# Patient Record
Sex: Male | Born: 1955 | Race: White | Hispanic: No | Marital: Married | Smoking: Former smoker
Health system: Southern US, Community
[De-identification: ages and names within clinical notes are randomized; demographics above are authoritative.]

## PROBLEM LIST (undated history)

## (undated) DIAGNOSIS — K589 Irritable bowel syndrome without diarrhea: Secondary | ICD-10-CM

## (undated) DIAGNOSIS — N200 Calculus of kidney: Secondary | ICD-10-CM

## (undated) DIAGNOSIS — L309 Dermatitis, unspecified: Secondary | ICD-10-CM

## (undated) DIAGNOSIS — G4730 Sleep apnea, unspecified: Secondary | ICD-10-CM

## (undated) DIAGNOSIS — D696 Thrombocytopenia, unspecified: Secondary | ICD-10-CM

## (undated) DIAGNOSIS — E785 Hyperlipidemia, unspecified: Secondary | ICD-10-CM

## (undated) DIAGNOSIS — M5136 Other intervertebral disc degeneration, lumbar region: Secondary | ICD-10-CM

## (undated) DIAGNOSIS — K219 Gastro-esophageal reflux disease without esophagitis: Secondary | ICD-10-CM

## (undated) DIAGNOSIS — M51369 Other intervertebral disc degeneration, lumbar region without mention of lumbar back pain or lower extremity pain: Secondary | ICD-10-CM

## (undated) HISTORY — PX: APPENDECTOMY: SHX54

## (undated) HISTORY — PX: HERNIA REPAIR: SHX51

## (undated) HISTORY — PX: COLONOSCOPY: SHX174

---

## 2006-05-28 ENCOUNTER — Ambulatory Visit: Payer: Self-pay | Admitting: Rheumatology

## 2006-06-16 ENCOUNTER — Ambulatory Visit: Payer: Self-pay | Admitting: Gastroenterology

## 2007-01-15 HISTORY — PX: EYE SURGERY: SHX253

## 2007-04-03 ENCOUNTER — Ambulatory Visit: Payer: Self-pay | Admitting: Internal Medicine

## 2008-08-13 IMAGING — CT CT ABD-PELV W/O CM
1 of 2 series · 15 of 32 positions shown, 19 images · non-contrast
Comparison: none

REASON FOR EXAM: Stones
COMMENTS:

PROCEDURE:     CT  - CT ABDOMEN AND PELVIS W[DATE] [DATE]
RESULT:     The patient reports a history of appendicitis with appendectomy.
TECHNIQUE: The study is performed utilizing a multislice helical acquisition
with data reconstructed at 3.0 mm slice thickness. No contrast was utilized
for the examination.

[Series 2: soft tissue · axial · 0.73mm/px · z∈[-492,-80]mm · 15 of 149 slices shown, 19 images]
[im 6/149  soft-tissue]
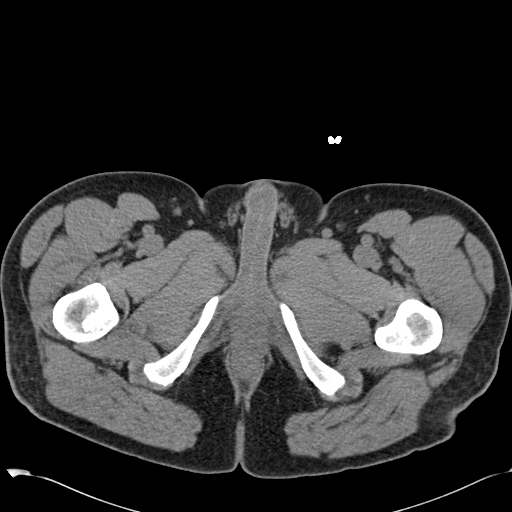
[im 6/149  bone]
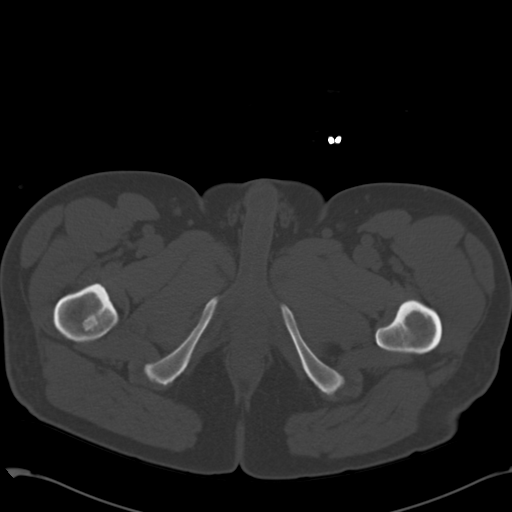
[im 18/149  soft-tissue]
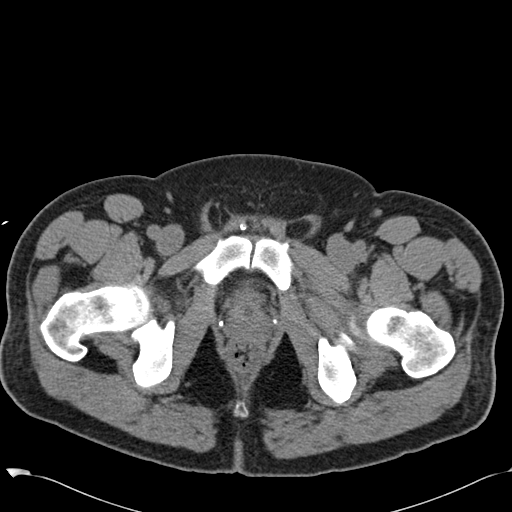
[im 29/149  soft-tissue]
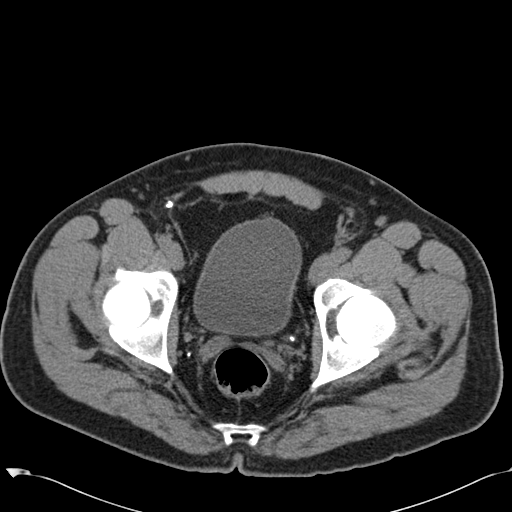
[im 40/149  soft-tissue]
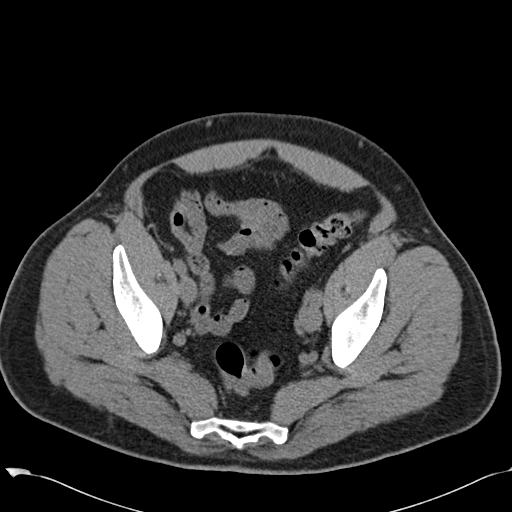
[im 52/149  soft-tissue]
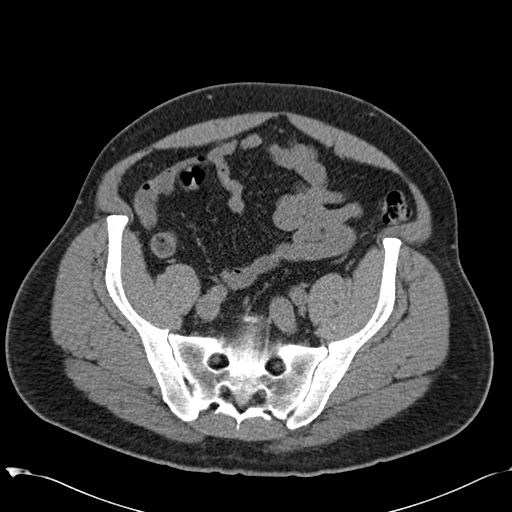
[im 63/149  soft-tissue]
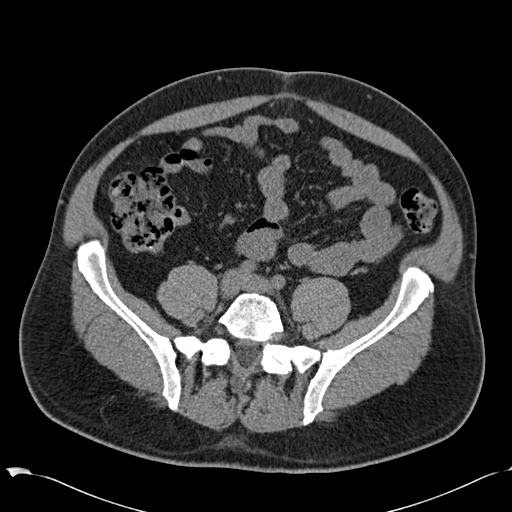
[im 75/149  soft-tissue]
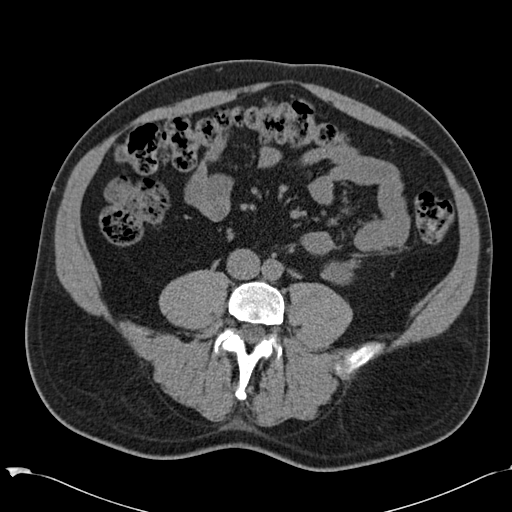
[im 86/149  soft-tissue]
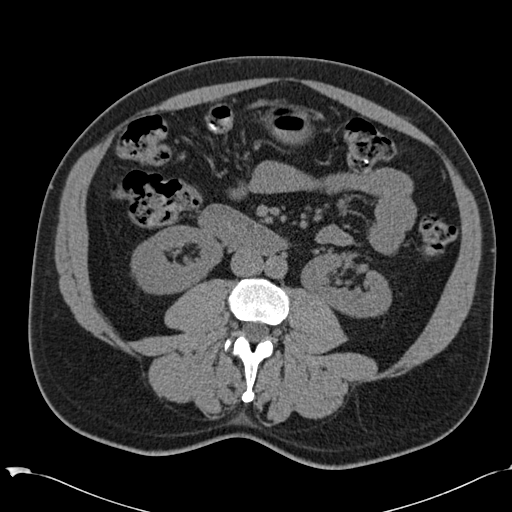
[im 97/149  soft-tissue]
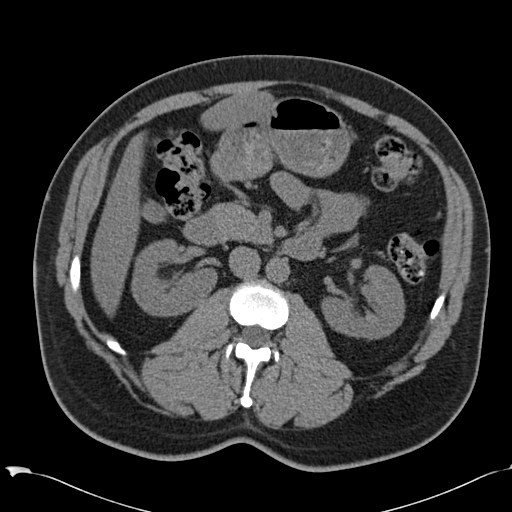
[im 97/149  bone]
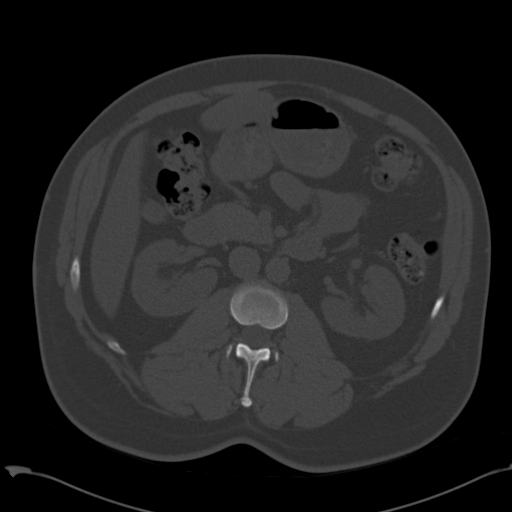
[im 109/149  soft-tissue]
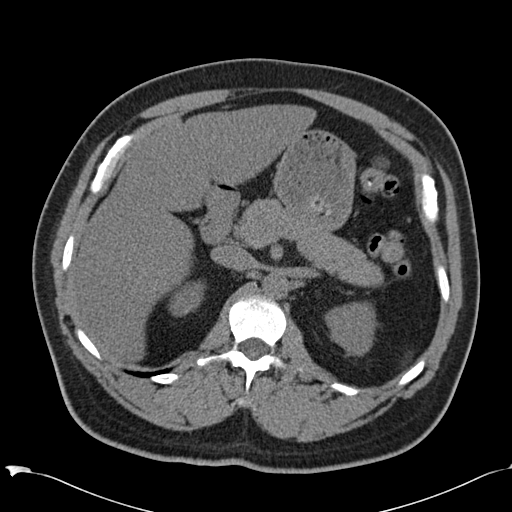
[im 120/149  soft-tissue]
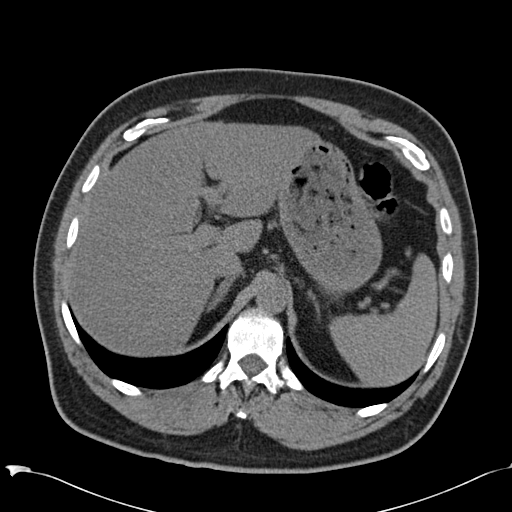
[im 126/149  lung]
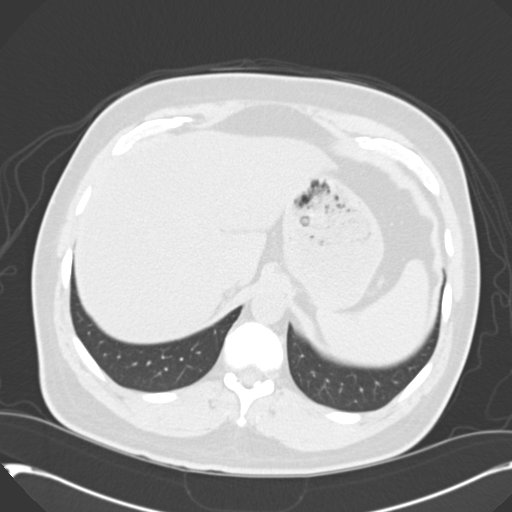
[im 131/149  soft-tissue]
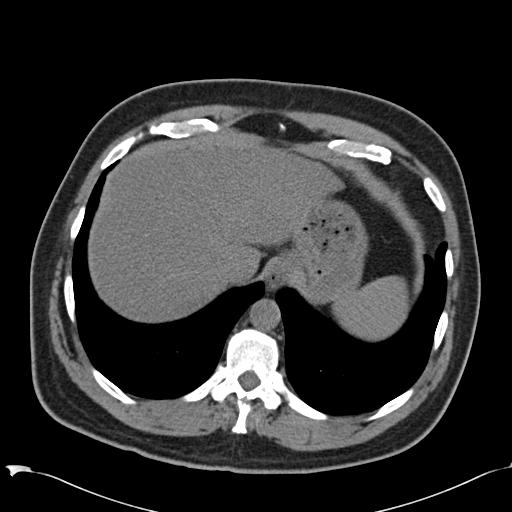
[im 131/149  lung]
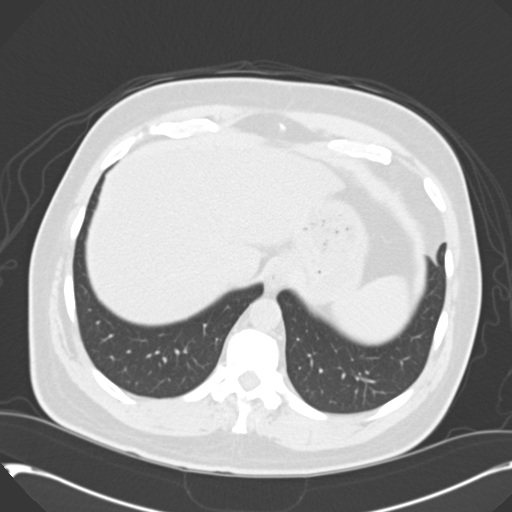
[im 137/149  lung]
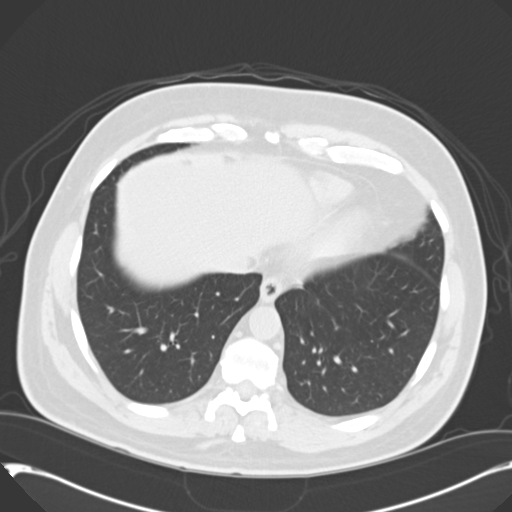
[im 143/149  soft-tissue]
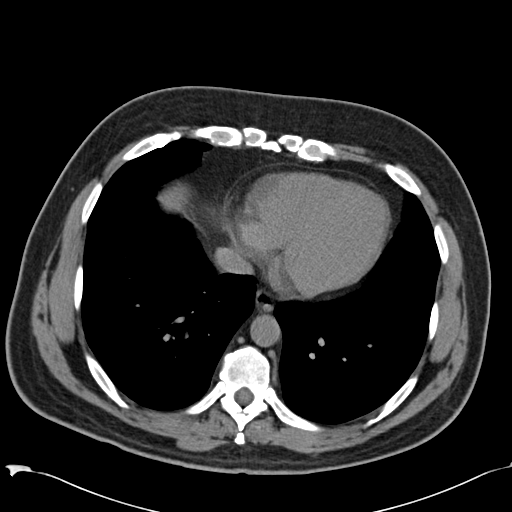
[im 143/149  lung]
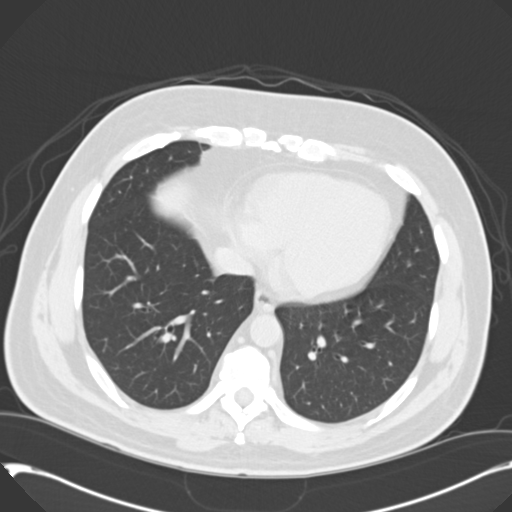

[15 of 32 positions shown; findings below may reference images not displayed]

FINDINGS: There is no evidence of urinary obstruction. On image #121, there
is the suggestion of a calcific density which could be in the distal LEFT
ureter. This measures approximately 2.0 mm. Again, there does not appear to
be significant hydroureter or hydronephrosis. Multiple, additional calcific
densities are seen in the pelvic region suggestive of phleboliths. No stones
are evident within the bladder. Multiple superficial densities are seen over
the pelvic region which may be consistent with previous hernia repair. There
is no abnormal bowel distention, free air or free fluid. There is a tiny
umbilical hernia containing fat only. The abdominal aorta is normal in
caliber. There is no significant atherosclerotic calcification. The liver,
spleen and pancreas appear unremarkable. The adrenal glands are within
normal limits. The gallbladder is present but not significantly distended.
No stones are evident.
IMPRESSION: 1.  Possible distal LEFT ureteral stone without significant obstruction. The
stone measures approximately 1.5 mm to possibly 2.0 mm in diameter.
2.  No acute inflammatory changes.
3.  Findings suggestive of previous hernia repair in the RIGHT inguinal
region.
4.  The lung bases are clear.

## 2012-02-05 ENCOUNTER — Ambulatory Visit: Payer: Self-pay | Admitting: Internal Medicine

## 2012-11-08 ENCOUNTER — Ambulatory Visit: Payer: Self-pay | Admitting: Specialist

## 2012-11-29 ENCOUNTER — Ambulatory Visit: Payer: Self-pay | Admitting: Specialist

## 2013-02-04 ENCOUNTER — Ambulatory Visit: Payer: Self-pay | Admitting: Gastroenterology

## 2013-06-17 IMAGING — US ULTRASOUND AORTA
1 series · 14 of 23 positions shown · non-contrast
Comparison: none

REASON FOR EXAM: family Hx AAA
COMMENTS:

[Series 1: ultrasound aorta · 0.31mm/px · 14 of 23 slices shown]
[im 1/23]
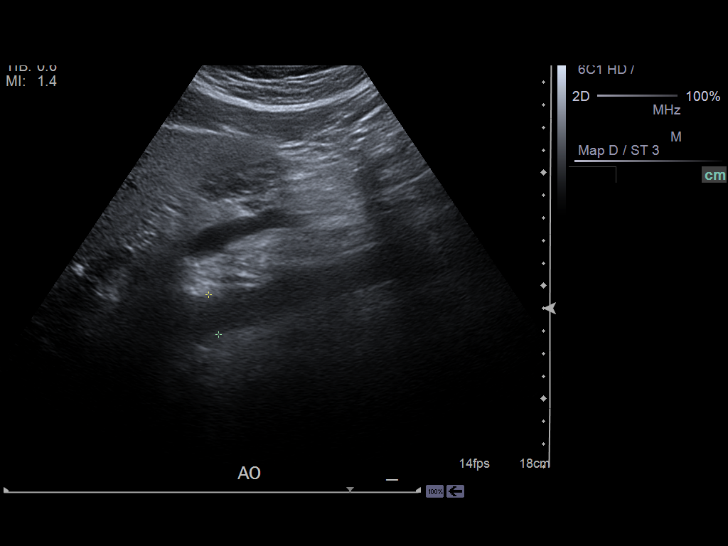
[im 3/23]
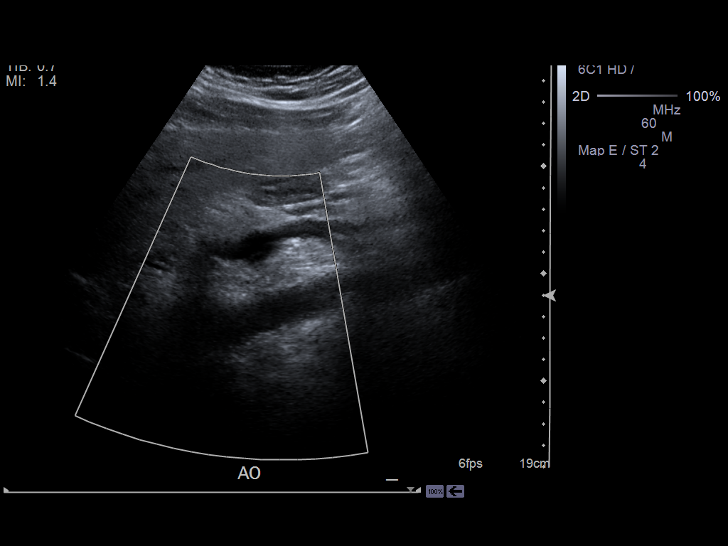
[im 5/23]
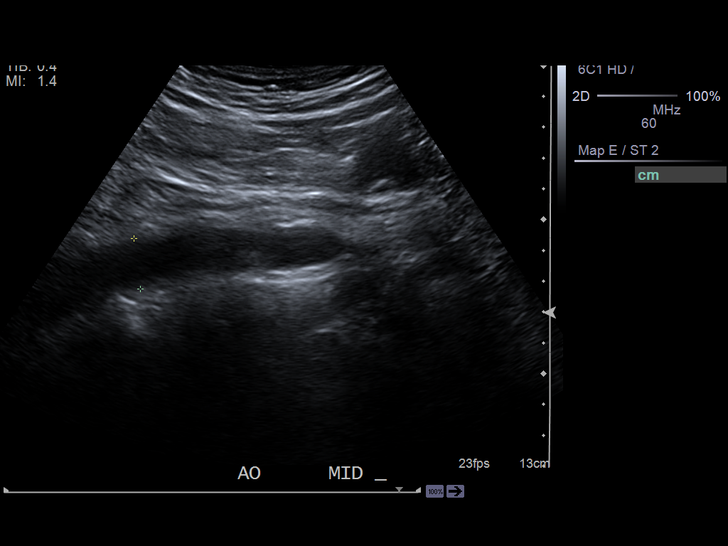
[im 6/23]
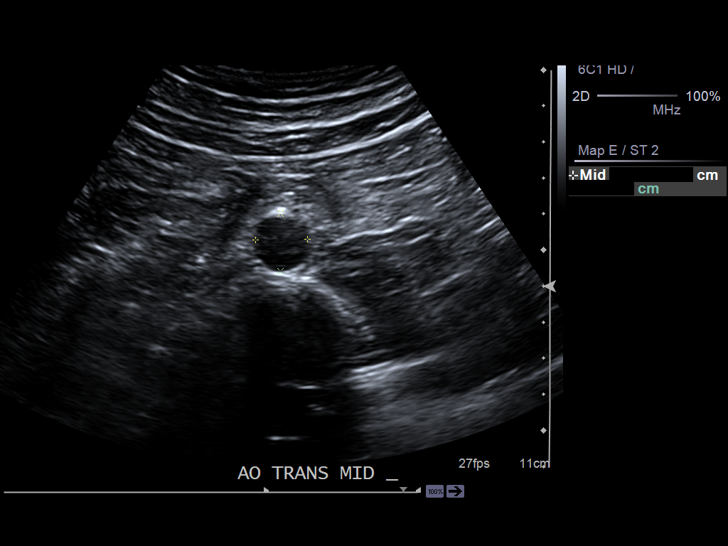
[im 8/23]
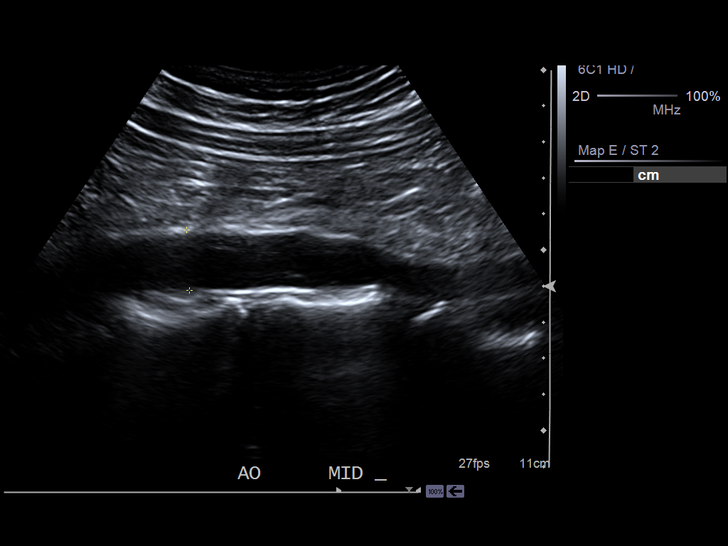
[im 10/23]
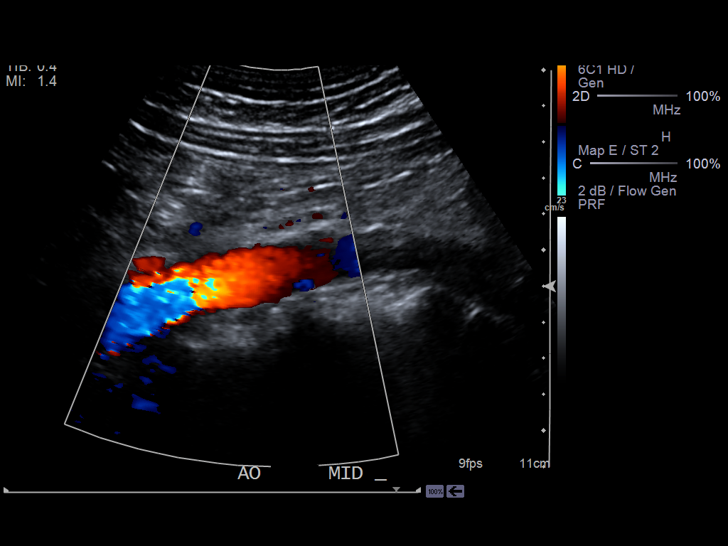
[im 11/23]
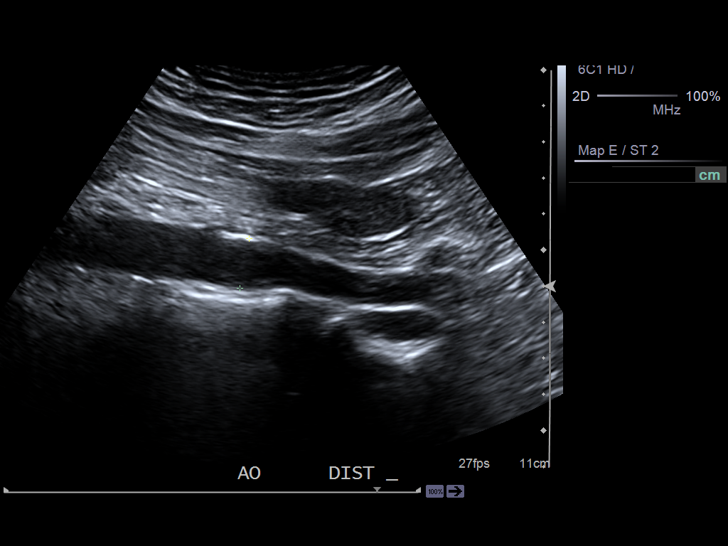
[im 13/23]
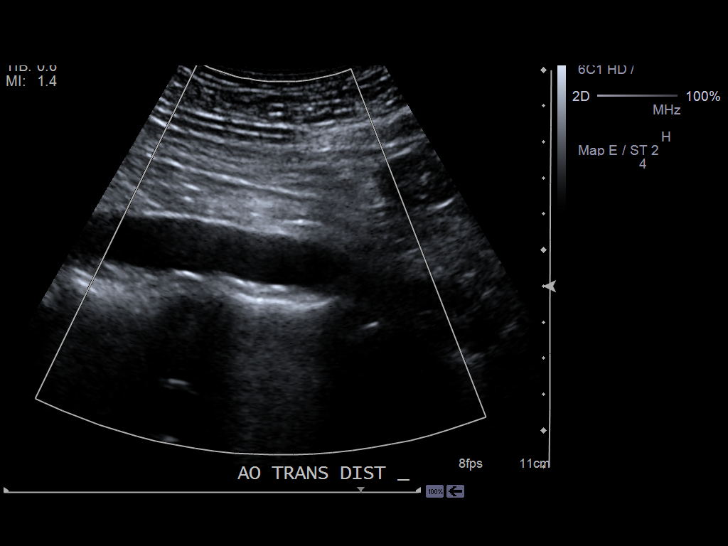
[im 14/23]
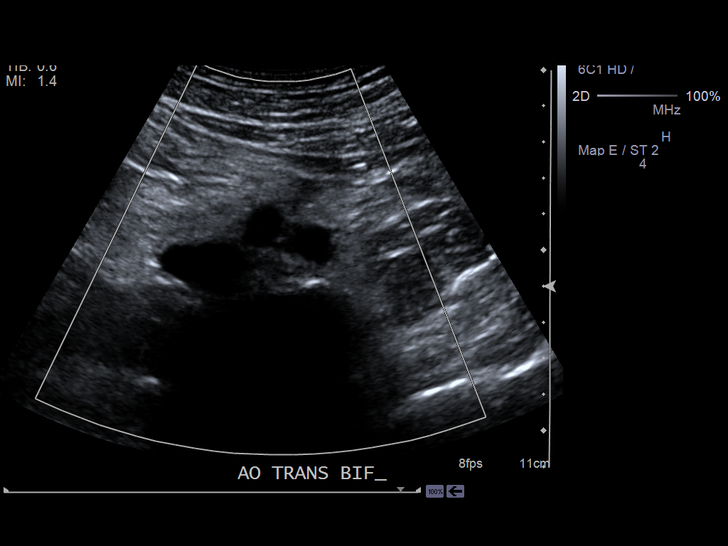
[im 16/23]
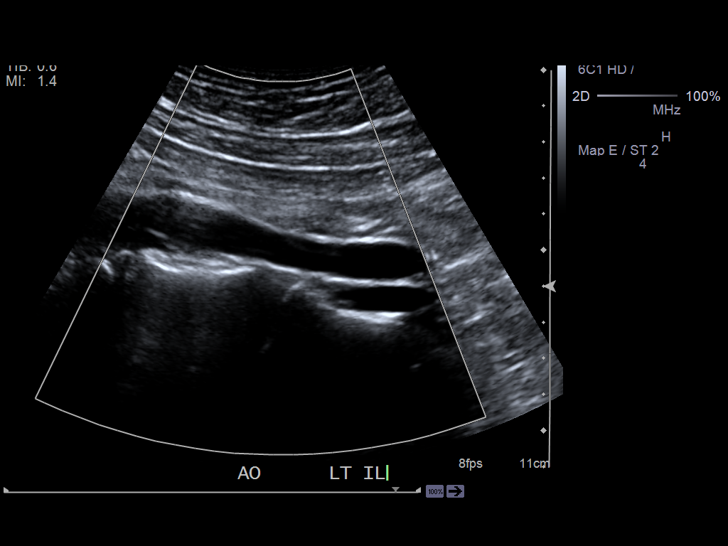
[im 18/23]
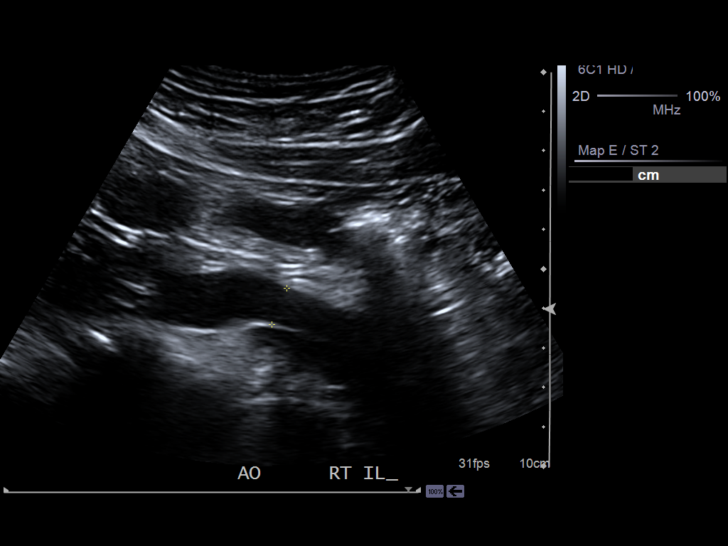
[im 19/23]
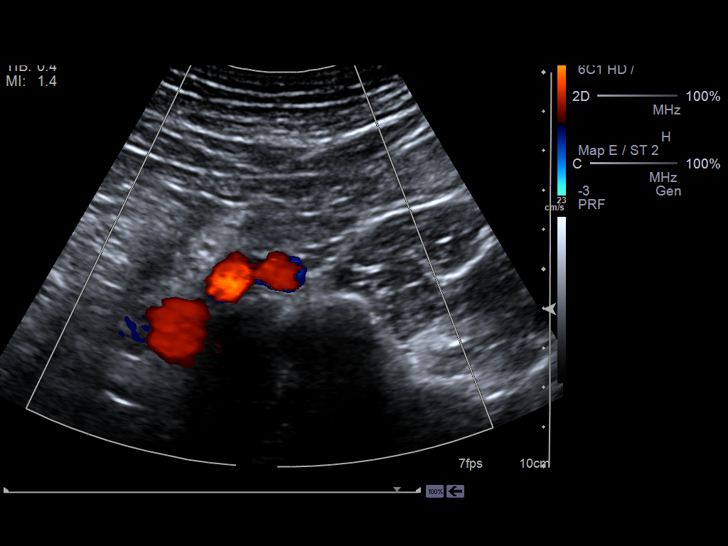
[im 21/23]
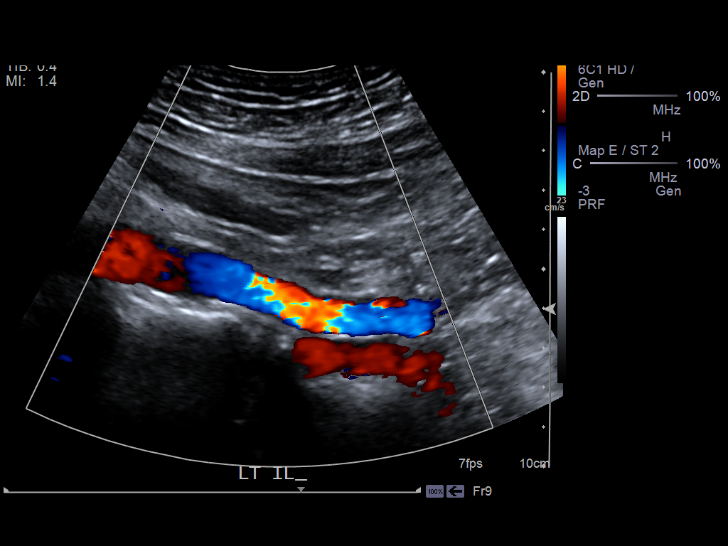
[im 23/23]
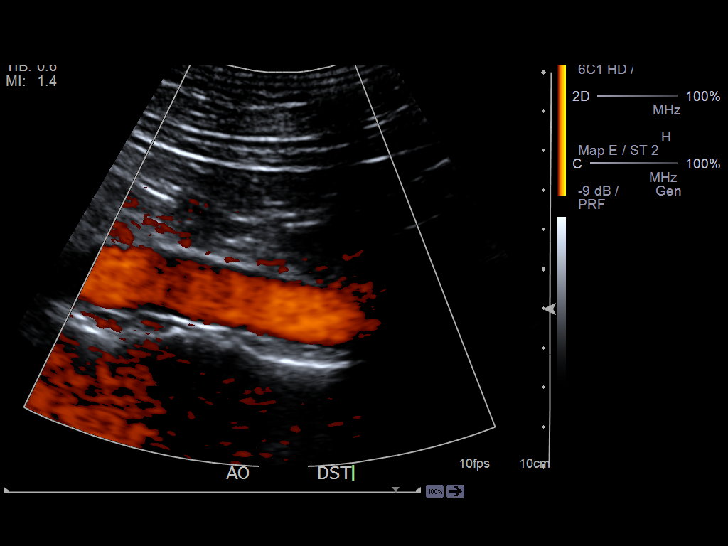

[14 of 23 positions shown; findings below may reference images not displayed]

PROCEDURE:     AGUEDO - AGUEDO AORTA  - February 05, 2012 [DATE]

RESULT:     Ultrasound of the abdominal aorta demonstrates normal tapering
of the distal aorta. Proximally, the AP diameter is 1.9 cm. Distally, the
diameter is 1.5 mm. The right and left iliac arteries are proximally 1.0 cm
in diameter.
IMPRESSION: No abdominal aortic aneurysm.

[REDACTED]

## 2015-11-27 ENCOUNTER — Ambulatory Visit: Payer: Managed Care, Other (non HMO) | Admitting: Anesthesiology

## 2015-11-27 ENCOUNTER — Encounter
Admission: RE | Disposition: A | Discharge status: Home / Self Care | Payer: Self-pay | Source: Ambulatory Visit | Attending: Gastroenterology

## 2015-11-27 ENCOUNTER — Encounter: Payer: Self-pay | Admitting: *Deleted

## 2015-11-27 ENCOUNTER — Ambulatory Visit
Admission: RE | Admit: 2015-11-27 | Discharge: 2015-11-27 | Disposition: A | Discharge status: Home / Self Care | Payer: Managed Care, Other (non HMO) | Source: Ambulatory Visit | Attending: Gastroenterology | Admitting: Gastroenterology

## 2015-11-27 DIAGNOSIS — Z87891 Personal history of nicotine dependence: Secondary | ICD-10-CM | POA: Insufficient documentation

## 2015-11-27 DIAGNOSIS — Z7982 Long term (current) use of aspirin: Secondary | ICD-10-CM | POA: Insufficient documentation

## 2015-11-27 DIAGNOSIS — K589 Irritable bowel syndrome without diarrhea: Secondary | ICD-10-CM | POA: Diagnosis not present

## 2015-11-27 DIAGNOSIS — G4730 Sleep apnea, unspecified: Secondary | ICD-10-CM | POA: Diagnosis not present

## 2015-11-27 DIAGNOSIS — M5136 Other intervertebral disc degeneration, lumbar region: Secondary | ICD-10-CM | POA: Insufficient documentation

## 2015-11-27 DIAGNOSIS — Z79899 Other long term (current) drug therapy: Secondary | ICD-10-CM | POA: Insufficient documentation

## 2015-11-27 DIAGNOSIS — K219 Gastro-esophageal reflux disease without esophagitis: Secondary | ICD-10-CM | POA: Insufficient documentation

## 2015-11-27 DIAGNOSIS — E785 Hyperlipidemia, unspecified: Secondary | ICD-10-CM | POA: Insufficient documentation

## 2015-11-27 HISTORY — DX: Thrombocytopenia, unspecified: D69.6

## 2015-11-27 HISTORY — PX: ESOPHAGOGASTRODUODENOSCOPY (EGD) WITH PROPOFOL: SHX5813

## 2015-11-27 HISTORY — DX: Gastro-esophageal reflux disease without esophagitis: K21.9

## 2015-11-27 HISTORY — DX: Other intervertebral disc degeneration, lumbar region: M51.36

## 2015-11-27 HISTORY — DX: Hyperlipidemia, unspecified: E78.5

## 2015-11-27 HISTORY — DX: Sleep apnea, unspecified: G47.30

## 2015-11-27 HISTORY — DX: Other intervertebral disc degeneration, lumbar region without mention of lumbar back pain or lower extremity pain: M51.369

## 2015-11-27 HISTORY — DX: Calculus of kidney: N20.0

## 2015-11-27 HISTORY — DX: Dermatitis, unspecified: L30.9

## 2015-11-27 HISTORY — DX: Irritable bowel syndrome, unspecified: K58.9

## 2015-11-27 SURGERY — ESOPHAGOGASTRODUODENOSCOPY (EGD) WITH PROPOFOL
Anesthesia: General

## 2015-11-27 MED ORDER — SODIUM CHLORIDE 0.9 % IV SOLN
INTRAVENOUS | Status: DC
  Administered 2015-11-27: 10:00:00 via INTRAVENOUS

## 2015-11-27 MED ORDER — SODIUM CHLORIDE 0.9 % IV SOLN
INTRAVENOUS | Status: DC
Start: 2015-11-27 — End: 2015-11-27

## 2015-11-27 MED ORDER — PROPOFOL 10 MG/ML IV BOLUS
INTRAVENOUS | Status: DC | PRN
  Administered 2015-11-27: 100 mg via INTRAVENOUS

## 2015-11-27 NOTE — Anesthesia Postprocedure Evaluation (Signed)
Anesthesia Post Note  Patient: Adrian Alvarado  Procedure(s) Performed: Procedure(s) (LRB): ESOPHAGOGASTRODUODENOSCOPY (EGD) WITH PROPOFOL (N/A)  Patient location during evaluation: PACU Anesthesia Type: General Level of consciousness: awake Pain management: satisfactory to patient Vital Signs Assessment: post-procedure vital signs reviewed and stable Respiratory status: nonlabored ventilation Cardiovascular status: blood pressure returned to baseline Anesthetic complications: no    Last Vitals:  Filed Vitals:   11/27/15 1116 11/27/15 1126  BP: 110/75 127/82  Pulse: 62 62  Temp:    Resp: 16 16    Last Pain: There were no vitals filed for this visit.               VAN STAVEREN,Mayco Walrond

## 2015-11-27 NOTE — H&P (Signed)
    Primary Care Physician:  Ezequiel Kayser, MD Primary Gastroenterologist:  Dr. Candace Cruise  Pre-Procedure History & Physical: HPI:  Adrian Alvarado is a 60 y.o. male is here for an EGD.   Past Medical History  Diagnosis Date  . DDD (degenerative disc disease), lumbar   . Dermatitis   . GERD (gastroesophageal reflux disease)   . Nephrolithiasis   . IBS (irritable bowel syndrome)   . Hyperlipidemia   . Sleep apnea     VPAP  . Thrombocytopenia ([place])     mild    Past Surgical History  Procedure Laterality Date  . Appendectomy    . Hernia repair    . Eye surgery Right 01/2007    extraction cataract extracapsular  . Colonoscopy      Prior to Admission medications   Medication Sig Start Date End Date Taking? Authorizing Provider  aspirin 325 MG tablet Take 325 mg by mouth daily.   Yes Historical Provider, MD  Cholecalciferol 5000 units TABS Take by mouth.   Yes Historical Provider, MD  CYANOCOBALAMIN PO Take by mouth.   Yes Historical Provider, MD  esomeprazole (NEXIUM) 40 MG capsule Take 40 mg by mouth daily at 12 noon.   Yes Historical Provider, MD  Iodoquinol-Aloe Polysaccharide (ALOQUIN) 1.25-1 % GEL Apply topically.   Yes Historical Provider, MD  magnesium oxide (MAG-OX) 400 MG tablet Take 400 mg by mouth daily.   Yes Historical Provider, MD  miconazole (MICOTIN) 2 % powder Apply topically as needed for itching.   Yes Historical Provider, MD  tacrolimus (PROTOPIC) 0.1 % ointment Apply topically 2 (two) times daily.   Yes Historical Provider, MD    Allergies as of 11/20/2015  . (Not on File)    History reviewed. No pertinent family history.  Social History   Social History  . Marital Status: Married    Spouse Name: N/A  . Number of Children: N/A  . Years of Education: N/A   Occupational History  . Not on file.   Social History Main Topics  . Smoking status: Former Research scientist (life sciences)  . Smokeless tobacco: Not on file  . Alcohol Use: No  . Drug Use: No  . Sexual Activity: Not on  file   Other Topics Concern  . Not on file   Social History Narrative  . No narrative on file    Review of Systems: See HPI, otherwise negative ROS  Physical Exam: BP 137/83 mmHg  Pulse 54  Temp(Src) 97.4 F (36.3 C) (Tympanic)  Resp 15  Ht 5\' 9"  (1.753 m)  Wt 93.895 kg (207 lb)  BMI 30.55 kg/m2  SpO2 100% General:   Alert,  pleasant and cooperative in NAD Head:  Normocephalic and atraumatic. Neck:  Supple; no masses or thyromegaly. Lungs:  Clear throughout to auscultation.    Heart:  Regular rate and rhythm. Abdomen:  Soft, nontender and nondistended. Normal bowel sounds, without guarding, and without rebound.   Neurologic:  Alert and  oriented x4;  grossly normal neurologically.  Impression/Plan: Adrian Alvarado is here for an EGD to be performed for GERD.  Risks, benefits, limitations, and alternatives regarding  EGD have been reviewed with the patient.  Questions have been answered.  All parties agreeable.   Alyxander Kollmann, Lupita Dawn, MD  11/27/2015, 10:40 AM

## 2015-11-27 NOTE — Anesthesia Procedure Notes (Signed)
Performed by: Jaunice Mirza Oxygen Delivery Method: Nasal cannula     

## 2015-11-27 NOTE — Anesthesia Preprocedure Evaluation (Signed)
Anesthesia Evaluation  Patient identified by MRN, date of birth, ID band Patient awake    Reviewed: Allergy & Precautions, NPO status , Patient's Chart, lab work & pertinent test results  Airway Mallampati: II       Dental  (+) Teeth Intact   Pulmonary sleep apnea and Continuous Positive Airway Pressure Ventilation , former smoker,    Pulmonary exam normal        Cardiovascular Exercise Tolerance: Good  Rhythm:Regular     Neuro/Psych    GI/Hepatic Neg liver ROS, GERD  ,  Endo/Other  negative endocrine ROS  Renal/GU      Musculoskeletal   Abdominal Normal abdominal exam  (+)   Peds  Hematology   Anesthesia Other Findings   Reproductive/Obstetrics                             Anesthesia Physical Anesthesia Plan  ASA: II  Anesthesia Plan: General   Post-op Pain Management:    Induction: Intravenous  Airway Management Planned: Natural Airway and Nasal Cannula  Additional Equipment:   Intra-op Plan:   Post-operative Plan:   Informed Consent: I have reviewed the patients History and Physical, chart, labs and discussed the procedure including the risks, benefits and alternatives for the proposed anesthesia with the patient or authorized representative who has indicated his/her understanding and acceptance.     Plan Discussed with: CRNA  Anesthesia Plan Comments:         Anesthesia Quick Evaluation

## 2015-11-27 NOTE — Transfer of Care (Signed)
Immediate Anesthesia Transfer of Care Note  Patient: Adrian Alvarado  Procedure(s) Performed: Procedure(s): ESOPHAGOGASTRODUODENOSCOPY (EGD) WITH PROPOFOL (N/A)  Patient Location: PACU  Anesthesia Type:General  Level of Consciousness: awake and alert   Airway & Oxygen Therapy: Patient Spontanous Breathing  Post-op Assessment: Report given to RN  Post vital signs: Reviewed and stable  Last Vitals:  Filed Vitals:   11/27/15 1006 11/27/15 1055  BP: 137/83 137/40  Pulse: 54   Temp: 36.3 C 36.8 C  Resp: 15 16    Complications: No apparent anesthesia complications

## 2015-11-27 NOTE — Op Note (Signed)
[facility] Gastroenterology Patient Name: Adrian Alvarado Procedure Date: 11/27/2015 10:45 AM MRN: [id number] Account #: [id number] Date of Birth: [birth year] Admit Type: Outpatient Age: 60 Room: [facility] ENDO ROOM 4 Gender: Male Note Status: Finalized Procedure:            Upper GI endoscopy Indications:          Suspected esophageal reflux, , Nexium works the best Providers:            Lupita Dawn. Candace Cruise, MD Referring MD:         Forest Gleason Md, MD (Referring MD) Medicines:            Monitored Anesthesia Care Complications:        No immediate complications. Procedure:            Pre-Anesthesia Assessment:                       - Prior to the procedure, a History and Physical was                        performed, and patient medications, allergies and                        sensitivities were reviewed. The patient's tolerance of                        previous anesthesia was reviewed.                       - The risks and benefits of the procedure and the                        sedation options and risks were discussed with the                        patient. All questions were answered and informed                        consent was obtained.                       - After reviewing the risks and benefits, the patient                        was deemed in satisfactory condition to undergo the                        procedure.                       After obtaining informed consent, the endoscope was                        passed under direct vision. Throughout the procedure,                        the patient's blood pressure, pulse, and oxygen                        saturations were monitored continuously. The Endoscope  was introduced through the mouth, and advanced to the                        second part of duodenum. The upper GI endoscopy was                        accomplished without difficulty. The patient tolerated                        the  procedure well. Findings:      The examined esophagus was normal. Biopsies were taken with a cold       forceps for histology.      The entire examined stomach was normal.      The examined duodenum was normal. Impression:           - Normal esophagus. Biopsied.                       - Normal stomach.                       - Normal examined duodenum. Recommendation:       - Discharge patient to home.                       - Observe patient's clinical course.                       - Continue present medications.                       - Await pathology results.                       - The findings and recommendations were discussed with                        the patient. Procedure Code(s):    --- Professional ---                       [phone], Esophagogastroduodenoscopy, flexible, transoral;                        with biopsy, single or multiple CPT copyright 2016 American Medical Association. All rights reserved. The codes documented in this report are preliminary and upon coder review may  be revised to meet current compliance requirements. Hulen Luster, MD 11/27/2015 10:53:59 AM This report has been signed electronically. Number of Addenda: 0 Note Initiated On: 11/27/2015 10:45 AM      [facility]

## 2015-11-28 ENCOUNTER — Encounter: Payer: Self-pay | Admitting: Gastroenterology

## 2015-11-28 LAB — SURGICAL PATHOLOGY

## 2016-02-17 ENCOUNTER — Ambulatory Visit
Admission: EM | Admit: 2016-02-17 | Discharge: 2016-02-17 | Disposition: A | Discharge status: Home / Self Care | Payer: Worker's Compensation | Attending: Family Medicine | Admitting: Family Medicine

## 2016-02-17 ENCOUNTER — Ambulatory Visit (INDEPENDENT_AMBULATORY_CARE_PROVIDER_SITE_OTHER): Payer: Worker's Compensation

## 2016-02-17 ENCOUNTER — Encounter: Payer: Self-pay | Admitting: Gynecology

## 2016-02-17 DIAGNOSIS — S61412A Laceration without foreign body of left hand, initial encounter: Secondary | ICD-10-CM

## 2016-02-17 DIAGNOSIS — L03119 Cellulitis of unspecified part of limb: Secondary | ICD-10-CM

## 2016-02-17 DIAGNOSIS — L02519 Cutaneous abscess of unspecified hand: Secondary | ICD-10-CM

## 2016-02-17 MED ORDER — SULFAMETHOXAZOLE-TRIMETHOPRIM 800-160 MG PO TABS: 1.0000 | ORAL_TABLET | Freq: Two times a day (BID) | ORAL | Status: DC

## 2016-02-17 MED ORDER — MUPIROCIN CALCIUM 2 % EX CREA: 1.0000 "application " | TOPICAL_CREAM | Freq: Two times a day (BID) | CUTANEOUS | Status: AC

## 2016-02-17 NOTE — ED Notes (Signed)
Patient c/o while at work x last pm at 6:15pm left hand laceration with a knife. Patient c/o swelling at area and pain.

## 2016-02-17 NOTE — ED Provider Notes (Signed)
CSN: [id number]     Arrival date & time 02/17/16  1422 History   First MD Initiated Contact with Patient 02/17/16 1504     Chief Complaint  Patient presents with  . Work Related Injury   (Consider location/radiation/quality/duration/timing/severity/associated sxs/prior Treatment) HPI Comments: Married caucasian male here for evaluation of left hand laceration at work last evening bled for about an hour he washed it out with soap, rubbing alcohol after it stopped bleeding and notified supervisor.  Was cleaning out tools with a knife when it slipped and it cut his left hand.   Here today due to swelling, pain, limited left index finger range of motion and strength, redness back of hand.  Right hand dominant  The history is provided by the patient.    Past Medical History  Diagnosis Date  . DDD (degenerative disc disease), lumbar   . Dermatitis   . GERD (gastroesophageal reflux disease)   . Nephrolithiasis   . IBS (irritable bowel syndrome)   . Hyperlipidemia   . Sleep apnea     VPAP  . Thrombocytopenia ([place])     mild   Past Surgical History  Procedure Laterality Date  . Appendectomy    . Hernia repair    . Eye surgery Right 01/2007    extraction cataract extracapsular  . Colonoscopy    . Esophagogastroduodenoscopy (egd) with propofol N/A 11/27/2015    Procedure: ESOPHAGOGASTRODUODENOSCOPY (EGD) WITH PROPOFOL;  Surgeon: Hulen Luster, MD;  Location: [facility] ENDOSCOPY;  Service: Gastroenterology;  Laterality: N/A;   History reviewed. No pertinent family history. Social History  Substance Use Topics  . Smoking status: Former Research scientist (life sciences)  . Smokeless tobacco: None  . Alcohol Use: No    Review of Systems  Constitutional: Negative for fever and chills.  HENT: Negative for congestion, ear pain and sore throat.   Eyes: Negative for pain and discharge.  Respiratory: Negative for cough and wheezing.   Cardiovascular: Negative for chest pain and leg swelling.  Gastrointestinal: Negative for  nausea, vomiting, diarrhea, constipation and blood in stool.  Genitourinary: Negative for dysuria, hematuria and difficulty urinating.  Musculoskeletal: Positive for myalgias and joint swelling. Negative for back pain and arthralgias.  Skin: Positive for color change, rash and wound.  Allergic/Immunologic: Negative for environmental allergies and food allergies.  Neurological: Negative for headaches.  Hematological: Negative for adenopathy. Does not bruise/bleed easily.  Psychiatric/Behavioral: Negative for confusion and agitation. The patient is not nervous/anxious.     Allergies  Review of patient's allergies indicates no known allergies.  Home Medications   Prior to Admission medications   Medication Sig Start Date End Date Taking? Authorizing Provider  aspirin 325 MG tablet Take 325 mg by mouth daily.   Yes Historical Provider, MD  Cholecalciferol 5000 units TABS Take by mouth.   Yes Historical Provider, MD  CYANOCOBALAMIN PO Take by mouth.   Yes Historical Provider, MD  esomeprazole (NEXIUM) 40 MG capsule Take 40 mg by mouth daily at 12 noon.   Yes Historical Provider, MD  Iodoquinol-Aloe Polysaccharide (ALOQUIN) 1.25-1 % GEL Apply topically.   Yes Historical Provider, MD  magnesium oxide (MAG-OX) 400 MG tablet Take 400 mg by mouth daily.   Yes Historical Provider, MD  miconazole (MICOTIN) 2 % powder Apply topically as needed for itching.   Yes Historical Provider, MD  tacrolimus (PROTOPIC) 0.1 % ointment Apply topically 2 (two) times daily.   Yes Historical Provider, MD  mupirocin cream (BACTROBAN) 2 % Apply 1 application topically 2 (two) times  daily. Until healed left hand laceration 02/17/16   Olen Cordial, NP  sulfamethoxazole-trimethoprim (BACTRIM DS,SEPTRA DS) 800-160 MG tablet Take 1 tablet by mouth 2 (two) times daily. 02/17/16   Olen Cordial, NP   Meds Ordered and Administered this Visit  Medications - No data to display  BP 129/83 mmHg  Pulse 72  Temp(Src) 98.4  F (36.9 C) (Oral)  Resp 16  Ht 5\' 9"  (1.753 m)  Wt 210 lb (95.255 kg)  BMI 31.00 kg/m2  SpO2 98% No data found.   Physical Exam  Constitutional: He is oriented to person, place, and time. Vital signs are normal. He appears well-developed and well-nourished.  Non-toxic appearance. He does not have a sickly appearance. He does not appear ill. No distress.  HENT:  Head: Normocephalic and atraumatic.  Right Ear: Hearing, external ear and ear canal normal.  Left Ear: Hearing, external ear and ear canal normal.  Nose: Nose normal.  Mouth/Throat: Uvula is midline, oropharynx is clear and moist and mucous membranes are normal. He does not have dentures. No oral lesions. No trismus in the jaw. Normal dentition. No dental abscesses, uvula swelling, lacerations or dental caries. No oropharyngeal exudate.  Eyes: Conjunctivae, EOM and lids are normal. Pupils are equal, round, and reactive to light. Right eye exhibits no discharge. Left eye exhibits no discharge. No scleral icterus.  Neck: Trachea normal and normal range of motion. Neck supple. No tracheal deviation present. No thyromegaly present.  Cardiovascular: Normal rate, regular rhythm, normal heart sounds and intact distal pulses.  Exam reveals no gallop and no friction rub.   No murmur heard. Pulses:      Radial pulses are 2+ on the right side, and 2+ on the left side.  Pulmonary/Chest: Effort normal and breath sounds normal. No stridor. No respiratory distress. He has no decreased breath sounds. He has no wheezes. He has no rhonchi. He has no rales.  Abdominal: Soft. He exhibits no distension.  Musculoskeletal: He exhibits edema and tenderness.       Right shoulder: Normal.       Left shoulder: Normal.       Right elbow: Normal.      Left elbow: Normal.       Right wrist: Normal.       Left wrist: Normal.       Left hand: He exhibits decreased range of motion, tenderness, bony tenderness, laceration and swelling. He exhibits normal  capillary refill and no deformity. Normal sensation noted. Decreased strength noted. He exhibits finger abduction and thumb/finger opposition. He exhibits no wrist extension trouble.       Hands: Decreased arom left index finger decreased strength with opposition compared to other fingers and right hand 4/5 versus 5/5  Neurological: He is alert and oriented to person, place, and time. He is not disoriented. He displays no atrophy and no tremor. No cranial nerve deficit or sensory deficit. He exhibits normal muscle tone. He displays no seizure activity. Coordination and gait normal. GCS eye subscore is 4. GCS verbal subscore is 5. GCS motor subscore is 6.  Skin: Skin is warm and dry. Laceration and rash noted. No abrasion, no bruising, no burn, no ecchymosis, no petechiae and no purpura noted. Rash is macular. Rash is not papular, not maculopapular, not nodular, not pustular, not vesicular and not urticarial. He is not diaphoretic. There is erythema. No cyanosis. Nails show no clubbing.  Psychiatric: He has a normal mood and affect. His speech is normal and  behavior is normal. Judgment and thought content normal. Cognition and memory are normal.    ED Course  Procedures (including critical care time)  Labs Review Labs Reviewed - No data to display  Imaging Review Dg Hand Complete Left  02/17/2016  CLINICAL DATA:  Laceration with knife involving index finger. EXAM: LEFT HAND - COMPLETE 3+ VIEW COMPARISON:  None. FINDINGS: Mild joint space narrowing and early spurring in the IP joints. Mild degenerative changes at the first carpometacarpal joint. No acute bony abnormality. Specifically, no fracture, subluxation, or dislocation. Soft tissues are intact. No radiopaque foreign body. IMPRESSION: No acute bony abnormality. Electronically Signed   By: Rolm Baptise M.D.   On: 02/17/2016 15:30     1515 to xray for left hand imaging.  Discussed ice/elevate take medications as prescribed.  Rule out fracture  appears to have cellulitis adjacent to laceration at this time.  Discussed if bone fracture recommended orthopedic evaluation today.   Discussed with patient to contact supervisor regarding where to fill prescriptions and if orthopedic specialist required if they have provider under contract.  HR closed today.  Patient verbalized understanding information/instructions, and agreed with plan of care.  1540 discussed xray results with patient and given copy of radiology report negative for fracture.  Continue with plan of care as previously discussed oral antibiotic bactrim DS po BID.  Discussed may interact with tacrolimus and cause hyperkalemia.  Discussed  With patient kidney disease, medications, high dietary intake can cause hyperkalemia.    Return to clinic, [facility] or ER for re-evaluation if muscle cramps, weakness, nausea, vomiting, diarrhea, fatigue, syncope and/or palpitations.  Spouse and Patient verbalized understanding of information/instructions, agreed with plan of care and had no further questions at this time.  MDM   1. Laceration of hand with complication, left, initial encounter   2. Cellulitis and abscess of hand    Patient was instructed to rest, ice and elevate left hand.  Do not soak hand until lacerations healed avoid pool, lake, hot tub, dirty sink water, chemicals at work.  Exitcare handout on laceration care given to patient.   Medications as directed.  Do not apply rubbing alcohol or hydrogen peroxide to skin.  Plain soap and water twice and day and as needed for soiling.  Tylenol 1000mg  po QID prn pain.  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  Avoid lifting greater than 5 lbs with left hand/pushing/pulling x 7 days.  Call or return to clinic as needed if these symptoms worsen or fail to improve as anticipated and will consider orthopedics evaluation.  Patient verbalized agreement and understanding of treatment plan and had no  further questions at this time.  P2:  ROM, injury prevention  Exitcare handout on skin infection given to patient.  RTC if worsening erythema, pain, purulent discharge, fever.  Wash towels, washcloths, sheets in hot water with bleach every couple of days until infection resolved. Bactrim DS po BID x 7 days.  Bactroban 2% ointment apply to laceration left hand twice a day until healed.  If erythema spreads to forearm follow up ER for re-evaluation this weekend as may require IV antibiotics.  Patient verbalized understanding, agreed with plan of care and had no further questions at this time.      Olen Cordial, NP 02/17/16 2214

## 2016-02-17 NOTE — Discharge Instructions (Signed)
Cellulitis Cellulitis is an infection of the skin and the tissue beneath it. The infected area is usually red and tender. Cellulitis occurs most often in the arms and lower legs.  CAUSES  Cellulitis is caused by bacteria that enter the skin through cracks or cuts in the skin. The most common types of bacteria that cause cellulitis are staphylococci and streptococci. SIGNS AND SYMPTOMS   Redness and warmth.  Swelling.  Tenderness or pain.  Fever. DIAGNOSIS  Your health care provider can usually determine what is wrong based on a physical exam. Blood tests may also be done. TREATMENT  Treatment usually involves taking an antibiotic medicine. HOME CARE INSTRUCTIONS   Take your antibiotic medicine as directed by your health care provider. Finish the antibiotic even if you start to feel better.  Keep the infected arm or leg elevated to reduce swelling.  Apply a warm cloth to the affected area up to 4 times per day to relieve pain.  Take medicines only as directed by your health care provider.  Keep all follow-up visits as directed by your health care provider. SEEK MEDICAL CARE IF:   You notice red streaks coming from the infected area.  Your red area gets larger or turns dark in color.  Your bone or joint underneath the infected area becomes painful after the skin has healed.  Your infection returns in the same area or another area.  You notice a swollen bump in the infected area.  You develop new symptoms.  You have a fever. SEEK IMMEDIATE MEDICAL CARE IF:   You feel very sleepy.  You develop vomiting or diarrhea.  You have a general ill feeling (malaise) with muscle aches and pains.   This information is not intended to replace advice given to you by your health care provider. Make sure you discuss any questions you have with your health care provider.   Document Released: 06/12/2005 Document Revised: 05/24/2015 Document Reviewed: 11/18/2011 Elsevier Interactive  Patient Education 2016 [place], Adult A laceration is a cut that goes through all of the layers of the skin and into the tissue that is right under the skin. Some lacerations heal on their own. Others need to be closed with stitches (sutures), staples, skin adhesive strips, or skin glue. Proper laceration care minimizes the risk of infection and helps the laceration to heal better. HOW TO CARE FOR YOUR LACERATION If sutures or staples were used:  Keep the wound clean and dry.  If you were given a bandage (dressing), you should change it at least one time per day or as told by your health care provider. You should also change it if it becomes wet or dirty.  Keep the wound completely dry for the first 24 hours or as told by your health care provider. After that time, you may shower or bathe. However, make sure that the wound is not soaked in water until after the sutures or staples have been removed.  Clean the wound one time each day or as told by your health care provider:  Wash the wound with soap and water.  Rinse the wound with water to remove all soap.  Pat the wound dry with a clean towel. Do not rub the wound.  After cleaning the wound, apply a thin layer of antibiotic ointmentas told by your health care provider. This will help to prevent infection and keep the dressing from sticking to the wound.  Have the sutures or staples removed as told by  your health care provider. If skin adhesive strips were used:  Keep the wound clean and dry.  If you were given a bandage (dressing), you should change it at least one time per day or as told by your health care provider. You should also change it if it becomes dirty or wet.  Do not get the skin adhesive strips wet. You may shower or bathe, but be careful to keep the wound dry.  If the wound gets wet, pat it dry with a clean towel. Do not rub the wound.  Skin adhesive strips fall off on their own. You may trim  the strips as the wound heals. Do not remove skin adhesive strips that are still stuck to the wound. They will fall off in time. If skin glue was used:  Try to keep the wound dry, but you may briefly wet it in the shower or bath. Do not soak the wound in water, such as by swimming.  After you have showered or bathed, gently pat the wound dry with a clean towel. Do not rub the wound.  Do not do any activities that will make you sweat heavily until the skin glue has fallen off on its own.  Do not apply liquid, cream, or ointment medicine to the wound while the skin glue is in place. Using those may loosen the film before the wound has healed.  If you were given a bandage (dressing), you should change it at least one time per day or as told by your health care provider. You should also change it if it becomes dirty or wet.  If a dressing is placed over the wound, be careful not to apply tape directly over the skin glue. Doing that may cause the glue to be pulled off before the wound has healed.  Do not pick at the glue. The skin glue usually remains in place for 5-10 days, then it falls off of the skin. General Instructions  Take over-the-counter and prescription medicines only as told by your health care provider.  If you were prescribed an antibiotic medicine or ointment, take or apply it as told by your doctor. Do not stop using it even if your condition improves.  To help prevent scarring, make sure to cover your wound with sunscreen whenever you are outside after stitches are removed, after adhesive strips are removed, or when glue remains in place and the wound is healed. Make sure to wear a sunscreen of at least 30 SPF.  Do not scratch or pick at the wound.  Keep all follow-up visits as told by your health care provider. This is important.  Check your wound every day for signs of infection. Watch for:  Redness, swelling, or pain.  Fluid, blood, or pus.  Raise (elevate) the  injured area above the level of your heart while you are sitting or lying down, if possible. SEEK MEDICAL CARE IF:  You received a tetanus shot and you have swelling, severe pain, redness, or bleeding at the injection site.  You have a fever.  A wound that was closed breaks open.  You notice a bad smell coming from your wound or your dressing.  You notice something coming out of the wound, such as wood or glass.  Your pain is not controlled with medicine.  You have increased redness, swelling, or pain at the site of your wound.  You have fluid, blood, or pus coming from your wound.  You notice a change in the color  of your skin near your wound.  You need to change the dressing frequently due to fluid, blood, or pus draining from the wound.  You develop a new rash.  You develop numbness around the wound. SEEK IMMEDIATE MEDICAL CARE IF:  You develop severe swelling around the wound.  Your pain suddenly increases and is severe.  You develop painful lumps near the wound or on skin that is anywhere on your body.  You have a red streak going away from your wound.  The wound is on your hand or foot and you cannot properly move a finger or toe.  The wound is on your hand or foot and you notice that your fingers or toes look pale or bluish.   This information is not intended to replace advice given to you by your health care provider. Make sure you discuss any questions you have with your health care provider.   Document Released: 09/02/2005 Document Revised: 01/17/2015 Document Reviewed: 08/29/2014 Elsevier Interactive Patient Education [employer].

## 2017-01-06 ENCOUNTER — Ambulatory Visit: Payer: Managed Care, Other (non HMO) | Attending: Gastroenterology | Admitting: Physical Therapy

## 2017-01-06 ENCOUNTER — Encounter: Payer: Self-pay | Admitting: Physical Therapy

## 2017-01-06 DIAGNOSIS — R278 Other lack of coordination: Secondary | ICD-10-CM | POA: Diagnosis present

## 2017-01-06 DIAGNOSIS — M6281 Muscle weakness (generalized): Secondary | ICD-10-CM

## 2017-01-06 NOTE — Therapy (Signed)
[place] MAIN [facility] SERVICES [address] [place], [place], [address] Phone: [phone]   Fax:  [phone]  Physical Therapy Evaluation  Patient Details  Name: Adrian Alvarado MRN: [id number] Date of Birth: 1955/10/03 Referring Provider: Gustavo Lah   Encounter Date: 01/06/2017      PT End of Session - 01/06/17 0926    Visit Number 1   Number of Visits 12   Date for PT Re-Evaluation 03/31/17   PT Start Time 0810   PT Stop Time 0930   PT Time Calculation (min) 80 min   Activity Tolerance Patient tolerated treatment well;No increased pain   Behavior During Therapy WFL for tasks assessed/performed      Past Medical History:  Diagnosis Date  . DDD (degenerative disc disease), lumbar   . Dermatitis   . GERD (gastroesophageal reflux disease)   . Hyperlipidemia   . IBS (irritable bowel syndrome)   . Nephrolithiasis   . Sleep apnea    VPAP  . Thrombocytopenia ([place])    mild    Past Surgical History:  Procedure Laterality Date  . APPENDECTOMY    . COLONOSCOPY    . ESOPHAGOGASTRODUODENOSCOPY (EGD) WITH PROPOFOL N/A 11/27/2015   Procedure: ESOPHAGOGASTRODUODENOSCOPY (EGD) WITH PROPOFOL;  Surgeon: Hulen Luster, MD;  Location: [facility] ENDOSCOPY;  Service: Gastroenterology;  Laterality: N/A;  . EYE SURGERY Right 01/2007   extraction cataract extracapsular  . HERNIA REPAIR Right early 2000s   inguinal     There were no vitals filed for this visit.       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 Leakage soils undergarments and he uses toilet to plug up the anus.  Pt frequently wipes and has sticky stools. 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 There was some jarring of the seat he was sitting on. Pt has performed a lot of lifting all of his life. Currently he is no longer performing this task. Pt is still  climbing up onto machinery (tractor) and lfiting 50-100 lb 1-3 x / week. He avoids heavy due to hernial surgery at groin but currently has one at the umbilicus which was due to the hernia repair at the groin when the surgeon went it at the belly button. Bowel frequency 5x / day. [place] Stool Type:  5-7 and by the end of the day, Type 4. Sometimes constipation and straining.  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 His back popped and his MRI told him he had spurs, DDD. Pt currently manages this pain by sleeping on the floor. Takes Aspirin for pain. The pain interferes with sleep and he can not sleep on his bed. Pain limits him from standing straight, getting in/out of car. Pain is an ache without shooting pain. Ache is at a a mild level. 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   3) Urinary leakage occurs random times.    Pertinent History Pt was Dx with IBS. Dx with hernial repair (early 2000). Family Hx of colon and lung CA. Last colonoscopy within the last 5 years with polyps removed. Fluid intake: not sure how much water, tea (2-3 glasses), coffee (1 cup), soda (16 fl oz).  [facility] PT Assessment - 01/06/17 0906      Assessment   Medical Diagnosis dyssynergia    Referring Provider [place]      Precautions   Precautions None     Balance Screen   Has the patient fallen in the past 6 months No     Observation/Other Assessments   Observations increased lumbar lordosis and thoracic kyphosis    Skin Integrity 3.5 cm width, 3 cm height umbilicus hernia      Coordination   Gross Motor Movements are Fluid and Coordinated --  abdominal strain with anal contraction cue   Fine Motor Movements are Fluid and Coordinated --  limited diaphragmatic excursin     AROM   Overall AROM Comments 25% rotation B and side flexion.       Bed Mobility   Bed Mobility --   log rolling                 Pelvic Floor Special Questions - 01/06/17 0914    Diastasis Recti 4 fingers above umbilicus           [facility] Adult PT Treatment/Exercise - 01/06/17 0906      Therapeutic Activites    Therapeutic Activities --  see pt instructions     Neuro Re-ed    Neuro Re-ed Details  see pt instructions                PT Education - 01/06/17 0927    Education provided Yes   Person(s) Educated Patient   Methods Explanation;Demonstration;Tactile cues;Verbal cues;Handout   Comprehension Verbalized understanding;Returned demonstration             PT Long Term Goals - 01/06/17 1037      PT LONG TERM GOAL #1   Title Pt will demo decrease abdominal separation from 4 fingers above umbilicus to < 2 fingers width in order to improve intraabdominal pressure for motility and postural stability for ADLs   Time 12   Period Weeks   Status New     PT LONG TERM GOAL #2   Title Pt will decrease his COREFO score from 23% to < 18% in order to improve pelvic floor function and QOL   Time 12   Period Weeks   Status New     PT LONG TERM GOAL #3   Title Pt will demo increased spinal ROM from 25% in rotation/ sideflexion B to full range in order to minimize CLBP and perform his overhead reaching, lifting with minimal risk for injuries at work and home    Time 12   Period Weeks   Status New     PT LONG TERM GOAL #4   Title Pt will demo proper pelvic floor coordination in order to eliminate completely    Time 12   Period Weeks   Status New     PT LONG TERM GOAL #5   Title Pt will be compliant with proper water to bladder irritant ratio (increase water intake by 2-3 glasses / day instead of minimal water and decrease soda from 1 bottle to 1/2 bottle) in order to achieve improved stool consistency   Time 12   Period Weeks   Status New     Additional Long Term Goals   Additional Long Term Goals Yes     PT LONG TERM GOAL #6   Title Pt will report  improved stool consistency from Type 5-7 to 4 about 75% of the time in order to ipe less  frequently and to work in his woodshop without leakage    Time 12   Period Weeks   Status New               Plan - 01/06/17 1044    Clinical Impression Statement Pt is a 61 yo male who reports fecal and  urinary leakage along with CLBP. 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 Pt 's clinical  presentations include signs of an inefficient intraabdominal system: diastasis recti, umbilical hernia, limited spinal  mobility, dyscoordination and strength of deep core mm, abdominal  straining with pelvic floor contraction. In addition, pt had poor knowledge of proper  water intake in ratio with bladder irritants. Following Tx today, pt  demo'd proper deep core coordination and demo'd HEP correctly to promote  spinal mobility.       Rehab Potential Good   PT Frequency 1x / week   PT Duration 12 weeks   PT Treatment/Interventions ADLs/Self Care Home Management;Patient/family education;Stair training;Functional mobility training;Therapeutic activities;Therapeutic exercise;Balance training;Neuromuscular re-education;Cognitive remediation;Moist Heat;Taping   PT Next Visit Plan assess inguinal hernia   Consulted and Agree with Plan of Care Patient      Patient will benefit from skilled therapeutic intervention in order to improve the following deficits and impairments:  Improper body mechanics, Decreased range of motion, Decreased coordination, Postural dysfunction, Impaired flexibility, Decreased strength, Decreased endurance, Impaired sensation, Pain, Decreased activity tolerance, Decreased balance, Decreased mobility, Difficulty walking  Visit Diagnosis: Muscle weakness (generalized) - Plan: PT plan of care cert/re-cert  Other lack of coordination - Plan: PT plan of care cert/re-cert     Problem List There are no active problems to display for  this patient.   Jerl Mina ,PT, DPT, E-RYT  01/06/2017, 10:54 AM  [place] MAIN [facility] SERVICES [address], [place], [address] Phone: [phone]   Fax:  [phone]  Name: Adrian Alvarado MRN: [id number] Date of Birth: 12-03-1955

## 2017-01-06 NOTE — Patient Instructions (Addendum)
Twice a day: You are now ready to begin training the deep core muscles system: diaphragm, transverse abdominis, pelvic floor . These muscles must work together as a team.          The key to these exercises to train the brain to coordinate the timing of these muscles and to have them turn on for long periods of time to hold you upright against gravity (especially important if you are on your feet all day).These muscles are postural muscles and play a role stabilizing your spine and bodyweight. By doing these repetitions slowly and correctly instead of doing crunches, you will achieve a flatter belly without a lower pooch. You are also placing your spine in a more neutral position and breathing properly which in turn, decreases your risk for problems related to your pelvic floor, abdominal, and low back such as pelvic organ prolapse, hernias, diastasis recti (separation of superficial muscles), disk herniations, spinal fractures. These exercises set a solid foundation for you to later progress to resistance/ strength training with therabands and weights and return to other typical fitness exercises with a stronger deeper core.   Do level 1 : 10 reps  (breathing am and pm)  Handout       __________  6 directions of spine to loosen up    Mini squat --> standing up with shoulder squeezes (not thrusting low back)  5 reps    Rotation trunk with arm swings , looking over shoulder 5 reps    Side to side reaching towards the knee 5 reps   __________     **Not push belly out with bowel movements. Instead breathe, feel pelvic floor and belly expand on inhale to relax                                          ** start drinking 2 glasses of water each day. First thing upon waking. Decrease soda by 1/2 bottle         _________

## 2017-01-13 ENCOUNTER — Ambulatory Visit: Payer: Managed Care, Other (non HMO) | Admitting: Physical Therapy

## 2017-01-13 DIAGNOSIS — M6281 Muscle weakness (generalized): Secondary | ICD-10-CM

## 2017-01-13 DIAGNOSIS — R278 Other lack of coordination: Secondary | ICD-10-CM

## 2017-01-13 NOTE — Therapy (Signed)
[place] MAIN [facility] SERVICES [address] [place], [place], [address] Phone: [phone]   Fax:  [phone]  Physical Therapy Treatment  Patient Details  Name: Adrian Alvarado MRN: [id number] Date of Birth: [birth year] Referring Provider: Gustavo Lah   Encounter Date: 01/13/2017      PT End of Session - 01/13/17 0849    Visit Number 2   Number of Visits 12   Date for PT Re-Evaluation 03/31/17   PT Start Time 0805   PT Stop Time 0900   PT Time Calculation (min) 55 min   Activity Tolerance Patient tolerated treatment well;No increased pain   Behavior During Therapy WFL for tasks assessed/performed      Past Medical History:  Diagnosis Date  . DDD (degenerative disc disease), lumbar   . Dermatitis   . GERD (gastroesophageal reflux disease)   . Hyperlipidemia   . IBS (irritable bowel syndrome)   . Nephrolithiasis   . Sleep apnea    VPAP  . Thrombocytopenia ([place])    mild    Past Surgical History:  Procedure Laterality Date  . APPENDECTOMY    . COLONOSCOPY    . ESOPHAGOGASTRODUODENOSCOPY (EGD) WITH PROPOFOL N/A 11/27/2015   Procedure: ESOPHAGOGASTRODUODENOSCOPY (EGD) WITH PROPOFOL;  Surgeon: Hulen Luster, MD;  Location: [facility] ENDOSCOPY;  Service: Gastroenterology;  Laterality: N/A;  . EYE SURGERY Right 01/2007   extraction cataract extracapsular  . HERNIA REPAIR Right early 2000s   inguinal     There were no vitals filed for this visit.      Subjective Assessment - 01/13/17 0808    Subjective Pt reports he has been practicing his HEP   Pertinent History Pt was Dx with IBS. Dx with hernial repair (early 2000). Family Hx of colon and lung CA. Last colonoscopy within the last 5 years with polyps removed. Fluid intake: not sure how much water, tea (2-3 glasses), coffee (1 cup), soda (16 fl oz).             [facility] PT Assessment - 01/13/17 0834      Coordination   Gross Motor Movements are Fluid and Coordinated --  cued for less  abdominal pushing w/ breathing     Posture/Postural Control   Posture Comments increased coordinatino with diaphragmatic excursion post Tx, cued for less perturbation in deep core level 2       AROM   Overall AROM Comments rotation 60% B, sidebend 25%  B      Bed Mobility   Bed Mobility --  cued for correct log rolling,                  Pelvic Floor Special Questions - 01/13/17 0834    Diastasis Recti 3 fingers (pre head crunch/Tx), 1 fingers width post headcrunch, Tx)            [facility] Adult PT Treatment/Exercise - 01/13/17 8546      Therapeutic Activites    Therapeutic Activities --  see pt instructions     Neuro Re-ed    Neuro Re-ed Details  see pt instructions     Manual Therapy   Manual therapy comments quadriped position, fascial pulling on rectus/ obliques with shoulder flexion, trunk rotaion B 5 reps,  fascial release over lateral ribs, intercostal mm, SCJ  B , STM along paraspinals                      PT Long Term Goals - 01/06/17  [place] #1   Title Pt will demo decrease abdominal separation from 4 fingers above umbilicus to < 2 fingers width in order to improve intraabdominal pressure for motility and postural stability for ADLs   Time 12   Period Weeks   Status New     PT LONG TERM GOAL #2   Title Pt will decrease his COREFO score from 23% to < 18% in order to improve pelvic floor function and QOL   Time 12   Period Weeks   Status New     PT LONG TERM GOAL #3   Title Pt will demo increased spinal ROM from 25% in rotation/ sideflexion B to full range in order to minimize CLBP and perform his overhead reaching, lifting with minimal risk for injuries at work and home    Time 12   Period Weeks   Status New     PT LONG TERM GOAL #4   Title Pt will demo proper pelvic floor coordination in order to eliminate completely    Time 12   Period Weeks   Status New     PT LONG TERM GOAL #5   Title Pt will be  compliant with proper water to bladder irritant ratio (increase water intake by 2-3 glasses / day instead of minimal water and decrease soda from 1 bottle to 1/2 bottle) in order to achieve improved stool consistency   Time 12   Period Weeks   Status New     Additional Long Term Goals   Additional Long Term Goals Yes     PT LONG TERM GOAL #6   Title Pt will report improved stool consistency from Type 5-7 to 4 about 75% of the time in order to ipe less frequently and 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 Pt demo'd more lateral diaphragmatic excursion and had one fingers width of separation at the linea alba (decreased from 4 fingers) following Tx. Pt also demo'd proper pelvic floor activation with training. Pt will continue to benefit from skilled PT.    Rehab Potential Good   PT Frequency 1x / week   PT Duration 12 weeks   PT Treatment/Interventions ADLs/Self Care Home Management;Patient/family education;Stair training;Functional mobility training;Therapeutic activities;Therapeutic exercise;Balance training;Neuromuscular re-education;Cognitive remediation;Moist Heat;Taping   PT Next Visit Plan assess inguinal hernia   Consulted and Agree with Plan of Care Patient      Patient will benefit from skilled therapeutic intervention in order to improve the following deficits and impairments:  Improper body mechanics, Decreased range of motion, Decreased coordination, Postural dysfunction, Impaired flexibility, Decreased strength, Decreased endurance, Impaired sensation, Pain, Decreased activity tolerance, Decreased balance, Decreased mobility, Difficulty walking  Visit Diagnosis: Muscle weakness (generalized)  Other lack of  coordination     Problem List There are no active problems to display for this patient.   Jerl Mina ,PT, DPT, E-RYT  01/13/2017, 8:57 AM  [place] MAIN [facility] SERVICES [address] [place], [place], [address] Phone: [phone]   Fax:  [phone]  Name: Adrian Alvarado MRN: [id number] Date of Birth: 1956/07/24

## 2017-01-13 NOTE — Patient Instructions (Signed)
    Log rolling out of .bed   R arm overhead  Raise hips and scoot hips to L   Drop knees to R,  scooting R shoulder back to get completely on your R side so your shoulders, hips, and knees point to the R    Then breathe as you drop feet off bed and prop onto R elbow and  use both hands to push yourself     ___________  (handout on deep core level 1 ,2 )

## 2017-01-20 ENCOUNTER — Ambulatory Visit: Payer: Managed Care, Other (non HMO) | Attending: Gastroenterology | Admitting: Physical Therapy

## 2017-01-20 DIAGNOSIS — M6281 Muscle weakness (generalized): Secondary | ICD-10-CM | POA: Diagnosis not present

## 2017-01-20 DIAGNOSIS — R278 Other lack of coordination: Secondary | ICD-10-CM | POA: Diagnosis present

## 2017-01-20 NOTE — Patient Instructions (Addendum)
Twice a day   Gentle stretches/ movements for the spine:  Laying on back:  6 directions of neck without lifting head off pillow  3 reps each direction    Open book ( handout) 15 reps   ________   Deep core level 1 ( breathing with quick pelvic floor squeeze). Do not push abdomen outward 10 reps    Deep core level 2 (30 reps) = 6 min      ________  Sleeping with pillows   One longways, the top one across  To create a ramp

## 2017-01-21 NOTE — Therapy (Signed)
[place] MAIN [facility] SERVICES [address] [place], [place], [address] Phone: [phone]   Fax:  [phone]  Physical Therapy Treatment  Patient Details  Name: Adrian Alvarado MRN: [id number] Date of Birth: 10-18-1955 Referring Provider: Gustavo Lah   Encounter Date: 01/20/2017      PT End of Session - 01/20/17 0856    Visit Number 3   Number of Visits 12   Date for PT Re-Evaluation 03/31/17   PT Start Time 0807   PT Stop Time 0900   PT Time Calculation (min) 53 min   Activity Tolerance Patient tolerated treatment well;No increased pain   Behavior During Therapy WFL for tasks assessed/performed      Past Medical History:  Diagnosis Date  . DDD (degenerative disc disease), lumbar   . Dermatitis   . GERD (gastroesophageal reflux disease)   . Hyperlipidemia   . IBS (irritable bowel syndrome)   . Nephrolithiasis   . Sleep apnea    VPAP  . Thrombocytopenia ([place])    mild    Past Surgical History:  Procedure Laterality Date  . APPENDECTOMY    . COLONOSCOPY    . ESOPHAGOGASTRODUODENOSCOPY (EGD) WITH PROPOFOL N/A 11/27/2015   Procedure: ESOPHAGOGASTRODUODENOSCOPY (EGD) WITH PROPOFOL;  Surgeon: Hulen Luster, MD;  Location: [facility] ENDOSCOPY;  Service: Gastroenterology;  Laterality: N/A;  . EYE SURGERY Right 01/2007   extraction cataract extracapsular  . HERNIA REPAIR Right early 2000s   inguinal     There were no vitals filed for this visit.      Subjective Assessment - 01/20/17 0812    Subjective Pt has not noticed any changes to his leakage but he continues to perform his exercises. Pt reports he has some tightness when takinga  deep breath. Pt has had issues with dust when breaking down insulation in his home and it is clearing out now. Pt has not been consistently using his CPAP machine because he was afraid it would push his dust into his lungs. Pt has not seen pulmonologist in a long time.  Pt sleeps with two pillows for acid reflex  and OSA.     Pertinent History Pt was Dx with IBS. Dx with hernial repair (early 2000). Family Hx of colon and lung CA. Last colonoscopy within the last 5 years with polyps removed. Fluid intake: not sure how much water, tea (2-3 glasses), coffee (1 cup), soda (16 fl oz).             [facility] PT Assessment - 01/21/17 0847      Coordination   Gross Motor Movements are Fluid and Coordinated --  increased diaphragmatic leateral excursion     Palpation   Spinal mobility significant hypomobility of C7-L1 and paraspinal tensions (post Tx: decreased)                   Pelvic Floor Special Questions - 01/20/17 0849    Diastasis Recti 3 fingers width (pre Tx: with deep cavitation along linea alba), post Tx: less cavitation and 1 fingers width)    External Perineal Exam through clothing. abdominal straining with cue for pelvic floor contraction. After cues, pt demo'd coordination of pelvic floor mm without abdominal straining            [facility] Adult PT Treatment/Exercise - 01/21/17 3474      Neuro Re-ed    Neuro Re-ed Details  see pt instructions     Manual Therapy   Manual therapy comments Grade III  mob PA along T1-L2 in prone, MWM with open book, traction/ STM along L lower cervical trigger point (C7).                 PT Education - 01/20/17 0856    Education provided Yes   Education Details HEP   Person(s) Educated Patient   Methods Explanation;Demonstration;Tactile cues;Verbal cues;Handout   Comprehension Returned demonstration;Verbalized understanding             PT Long Term Goals - 01/21/17 7425      PT LONG TERM GOAL #1   Title Pt will demo decrease abdominal separation from 4 fingers above umbilicus to < 2 fingers width in order to improve intraabdominal pressure for motility and postural stability for ADLs   Time 12   Period Weeks   Status Achieved     PT LONG TERM GOAL #2   Title Pt will decrease his COREFO score from 23% to < 18% in order to  improve pelvic floor function and QOL   Time 12   Period Weeks   Status On-going     PT LONG TERM GOAL #3   Title Pt will demo increased spinal ROM from 25% in rotation/ sideflexion B to full range in order to minimize CLBP and perform his overhead reaching, lifting with minimal risk for injuries at work and home    Time 12   Period Weeks   Status Achieved     PT LONG TERM GOAL #4   Title Pt will demo proper pelvic floor coordination in order to eliminate completely    Time 12   Period Weeks   Status On-going     PT LONG TERM GOAL #5   Title Pt will be compliant with proper water to bladder irritant ratio (increase water intake by 2-3 glasses / day instead of minimal water and decrease soda from 1 bottle to 1/2 bottle) in order to achieve improved stool consistency   Time 12   Period Weeks   Status On-going     PT LONG TERM GOAL #6   Title Pt will report improved stool consistency from Type 5-7 to 4 about 75% of the time in order to ipe less frequently and 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 Today, pt responded well to manual Tx to increase lateral diaphragmatic/pelvic floor excursion and decrease thoracic hypomobility.  Pt reported he was able to breath better with less tightness and pain. Pt would benefit from a referral to a pulmonologist due to his report of inhaling alot of dust when handling insulation in his home. Pt was educated to be compliant with his CPAP machine use with explanation of the importance of managing OSA and he voiced agreement.  Pt continues to benefit from skilled PT. Plan to address more spinal flexibility and begin pelvic floor strengthening at next session.    Rehab Potential Good   PT Frequency 1x / week   PT Duration 12 weeks   PT Treatment/Interventions ADLs/Self Care Home  Management;Patient/family education;Stair training;Functional mobility training;Therapeutic activities;Therapeutic exercise;Balance training;Neuromuscular re-education;Cognitive remediation;Moist Heat;Taping   PT Next Visit Plan assess inguinal hernia   Consulted and Agree with Plan of Care Patient      Patient will benefit from  skilled therapeutic intervention in order to improve the following deficits and impairments:  Improper body mechanics, Decreased range of motion, Decreased coordination, Postural dysfunction, Impaired flexibility, Decreased strength, Decreased endurance, Impaired sensation, Pain, Decreased activity tolerance, Decreased balance, Decreased mobility, Difficulty walking  Visit Diagnosis: Muscle weakness (generalized)  Other lack of coordination     Problem List There are no active problems to display for this patient.   Jerl Mina ,PT, DPT, E-RYT  01/21/2017, 8:54 AM  [place] MAIN [facility] SERVICES [address] [place], [place], [address] Phone: [phone]   Fax:  [phone]  Name: Adrian Alvarado MRN: [id number] Date of Birth: May 02, 1956

## 2017-01-27 ENCOUNTER — Ambulatory Visit: Payer: Managed Care, Other (non HMO) | Admitting: Physical Therapy

## 2017-01-27 DIAGNOSIS — M6281 Muscle weakness (generalized): Secondary | ICD-10-CM

## 2017-01-27 DIAGNOSIS — R278 Other lack of coordination: Secondary | ICD-10-CM

## 2017-01-27 NOTE — Patient Instructions (Signed)
Mini squats 10 reps  To expand pelvic floor    _________   Incorporate into deep core level 1  Focus on dilating circle imagery, elevator imagery  For full range of motion at the pelvic floor   Inhale, lowering of pelvic floor (dilating)  Exhale, closes and lifts   Inhale again, quickly allow for the lowering and dropping (dilating)    _________ Adrian Prince between knees when laying on your side   __________  On the toilet :  feet on 2" top platform     Pelvis tilts forward and breathing as you practiced today    Shoulders back    Softer sounding breath to not breath into chest

## 2017-01-27 NOTE — Therapy (Signed)
[place] MAIN [facility] SERVICES [address] [place], [place], [address] Phone: [phone]   Fax:  [phone]  Physical Therapy Treatment  Patient Details  Name: Adrian Alvarado MRN: [id number] Date of Birth: 19-Oct-1955 Referring Provider: Gustavo Lah   Encounter Date: 01/27/2017      PT End of Session - 01/27/17 0812    Visit Number 4   Number of Visits 12   Date for PT Re-Evaluation 03/31/17   PT Start Time 0810   PT Stop Time 0900   PT Time Calculation (min) 50 min   Activity Tolerance Patient tolerated treatment well;No increased pain   Behavior During Therapy WFL for tasks assessed/performed      Past Medical History:  Diagnosis Date  . DDD (degenerative disc disease), lumbar   . Dermatitis   . GERD (gastroesophageal reflux disease)   . Hyperlipidemia   . IBS (irritable bowel syndrome)   . Nephrolithiasis   . Sleep apnea    VPAP  . Thrombocytopenia ([place])    mild    Past Surgical History:  Procedure Laterality Date  . APPENDECTOMY    . COLONOSCOPY    . ESOPHAGOGASTRODUODENOSCOPY (EGD) WITH PROPOFOL N/A 11/27/2015   Procedure: ESOPHAGOGASTRODUODENOSCOPY (EGD) WITH PROPOFOL;  Surgeon: Hulen Luster, MD;  Location: [facility] ENDOSCOPY;  Service: Gastroenterology;  Laterality: N/A;  . EYE SURGERY Right 01/2007   extraction cataract extracapsular  . HERNIA REPAIR Right early 2000s   inguinal     There were no vitals filed for this visit.      Subjective Assessment - 01/27/17 0813    Subjective Pt has not performed as much heavy lifting. Pt reported his bowel movements do not come in solid pieces and he has to dig it out. Near the end of the day, his bowels firm up more.  Pt has increased his water, and drinking less tea.  Pt continue to perform his deep core exercises.    Pertinent History Pt was Dx with IBS. Dx with hernial repair (early 2000). Family Hx of colon and lung CA. Last colonoscopy within the last 5 years with polyps  removed. Fluid intake: not sure how much water, tea (2-3 glasses), coffee (1 cup), soda (16 fl oz).                       Pelvic Floor Special Questions - 01/27/17 0852    Diastasis Recti no fingers width, less cavitation along linea alba    Pelvic Floor Internal Exam pt consented verbally without contraindications    Exam Type Rectal   Palpation slight tensions at external sphincter posterior. delayed lengthening of pelvic floor mm. Cued with tactile and visual cues for sequential and circumferential relaxation of mm            [facility] Adult PT Treatment/Exercise - 01/27/17 0856      Therapeutic Activites    Therapeutic Activities --  see pt instructions     Neuro Re-ed    Neuro Re-ed Details  see pt instructions     Manual Therapy   Manual therapy comments intrarectally: STM at posterior external sphincter                      PT Long Term Goals - 01/21/17 6222      PT LONG TERM GOAL #1   Title Pt will demo decrease abdominal separation from 4 fingers above umbilicus to < 2 fingers width in order  to improve intraabdominal pressure for motility and postural stability for ADLs   Time 12   Period Weeks   Status Achieved     PT LONG TERM GOAL #2   Title Pt will decrease his COREFO score from 23% to < 18% in order to improve pelvic floor function and QOL   Time 12   Period Weeks   Status On-going     PT LONG TERM GOAL #3   Title Pt will demo increased spinal ROM from 25% in rotation/ sideflexion B to full range in order to minimize CLBP and perform his overhead reaching, lifting with minimal risk for injuries at work and home    Time 12   Period Weeks   Status Achieved     PT LONG TERM GOAL #4   Title Pt will demo proper pelvic floor coordination in order to eliminate completely    Time 12   Period Weeks   Status On-going     PT LONG TERM GOAL #5   Title Pt will be compliant with proper water to bladder irritant ratio (increase water intake  by 2-3 glasses / day instead of minimal water and decrease soda from 1 bottle to 1/2 bottle) in order to achieve improved stool consistency   Time 12   Period Weeks   Status On-going     PT LONG TERM GOAL #6   Title Pt will report improved stool consistency from Type 5-7 to 4 about 75% of the time in order to ipe less frequently and 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 Today, pt showed improved pelvic floor lengthening after intrarectal assessment and Tx.  Pt also demo'd understanding on creating an anterior pelvic tilt when toileting. Anticipate pt will improve with elimination of bowels without having to splint after learning  this coordination training.Pt continues to make great progress towards his goals with skilled PT.    Rehab Potential Good   PT Frequency 1x / week   PT Duration 12 weeks   PT Treatment/Interventions ADLs/Self Care Home Management;Patient/family education;Stair training;Functional mobility training;Therapeutic activities;Therapeutic exercise;Balance training;Neuromuscular re-education;Cognitive remediation;Moist Heat;Taping   PT Next Visit Plan assess inguinal hernia   Consulted and Agree with Plan of Care Patient      Patient will benefit from skilled therapeutic intervention in order to improve the following deficits and impairments:  Improper body mechanics, Decreased range of motion, Decreased coordination, Postural dysfunction, Impaired flexibility, Decreased strength, Decreased endurance, Impaired sensation, Pain, Decreased activity tolerance, Decreased balance, Decreased mobility, Difficulty walking  Visit Diagnosis: Muscle weakness (generalized)  Other lack of coordination     Problem List There are no active problems to display for this patient.   Jerl Mina  ,PT, DPT, E-RYT  01/27/2017, 9:19 AM  [place] MAIN [facility] SERVICES [address] [place], [place], [address] Phone: [phone]   Fax:  [phone]  Name: Adrian Alvarado MRN: [id number] Date of Birth: October 02, 1955

## 2017-02-17 ENCOUNTER — Ambulatory Visit: Payer: Managed Care, Other (non HMO) | Attending: Gastroenterology | Admitting: Physical Therapy

## 2017-02-17 DIAGNOSIS — M6281 Muscle weakness (generalized): Secondary | ICD-10-CM

## 2017-02-17 DIAGNOSIS — R278 Other lack of coordination: Secondary | ICD-10-CM | POA: Insufficient documentation

## 2017-02-17 NOTE — Therapy (Signed)
[place] MAIN [facility] SERVICES [address] [place], [place], [address] Phone: [phone]   Fax:  [phone]  Physical Therapy Treatment  Patient Details  Name: Adrian Alvarado MRN: [id number] Date of Birth: Jun 04, 1956 Referring Provider: Gustavo Lah   Encounter Date: 02/17/2017      PT End of Session - 02/17/17 0854    Visit Number 5   Number of Visits 12   Date for PT Re-Evaluation 03/31/17   PT Start Time 0810   PT Stop Time 0900   PT Time Calculation (min) 50 min   Activity Tolerance Patient tolerated treatment well;No increased pain   Behavior During Therapy WFL for tasks assessed/performed      Past Medical History:  Diagnosis Date  . DDD (degenerative disc disease), lumbar   . Dermatitis   . GERD (gastroesophageal reflux disease)   . Hyperlipidemia   . IBS (irritable bowel syndrome)   . Nephrolithiasis   . Sleep apnea    VPAP  . Thrombocytopenia ([place])    mild    Past Surgical History:  Procedure Laterality Date  . APPENDECTOMY    . COLONOSCOPY    . ESOPHAGOGASTRODUODENOSCOPY (EGD) WITH PROPOFOL N/A 11/27/2015   Procedure: ESOPHAGOGASTRODUODENOSCOPY (EGD) WITH PROPOFOL;  Surgeon: Hulen Luster, MD;  Location: [facility] ENDOSCOPY;  Service: Gastroenterology;  Laterality: N/A;  . EYE SURGERY Right 01/2007   extraction cataract extracapsular  . HERNIA REPAIR Right early 2000s   inguinal     There were no vitals filed for this visit.      Subjective Assessment - 02/17/17 0815    Subjective Pt reports he has not noticed much difference with his Sx . Pt has started to drink more water and less tea. Pt also has been doing his exercises and notices he is able to relax his pelvic floor muscles more quickly once he gets tot he end of his breath .    Pertinent History Pt was Dx with IBS. Dx with hernial repair (early 2000). Family Hx of colon and lung CA. Last colonoscopy within the last 5 years with polyps removed. Fluid intake: not sure  how much water, tea (2-3 glasses), coffee (1 cup), soda (16 fl oz).             [facility] PT Assessment - 02/17/17 0851      Coordination   Gross Motor Movements are Fluid and Coordinated --  perturbation of lumbopelvis with deep core level 2                  Pelvic Floor Special Questions - 02/17/17 0850    Diastasis Recti 1 fingers width above umbilicus post head crunch, 3 fingers pre. no fingers width before umbilicus pre and post crunch   Pelvic Floor Internal Exam pt consented verbally without contraindications    Exam Type Rectal   Palpation no mm tensions    Strength good squeeze, good lift, able to hold agaisnt strong resistance  reflexive contraction,.cued for lengthening   Strength # of reps 7   Strength # of seconds 3           [facility] Adult PT Treatment/Exercise - 02/17/17 1324      Neuro Re-ed    Neuro Re-ed Details  see pt instructions                PT Education - 02/17/17 0853    Education provided Yes   Education Details musculoskeletal improvements with improved pelvic floor lengthening  but will work on endurance strengthening of pelvic floor. Plan to strengthen thorcolumbar mm at next session. Refer out to nutritionist if needed.    Person(s) Educated Patient   Methods Explanation;Demonstration;Tactile cues;Verbal cues;Handout   Comprehension Returned demonstration;Verbalized understanding;Verbal cues required;Tactile cues required             PT Long Term Goals - 01/21/17 5366      PT LONG TERM GOAL #1   Title Pt will demo decrease abdominal separation from 4 fingers above umbilicus to < 2 fingers width in order to improve intraabdominal pressure for motility and postural stability for ADLs   Time 12   Period Weeks   Status Achieved     PT LONG TERM GOAL #2   Title Pt will decrease his COREFO score from 23% to < 18% in order to improve pelvic floor function and QOL   Time 12   Period Weeks   Status On-going     PT LONG  TERM GOAL #3   Title Pt will demo increased spinal ROM from 25% in rotation/ sideflexion B to full range in order to minimize CLBP and perform his overhead reaching, lifting with minimal risk for injuries at work and home    Time 12   Period Weeks   Status Achieved     PT LONG TERM GOAL #4   Title Pt will demo proper pelvic floor coordination in order to eliminate completely    Time 12   Period Weeks   Status On-going     PT LONG TERM GOAL #5   Title Pt will be compliant with proper water to bladder irritant ratio (increase water intake by 2-3 glasses / day instead of minimal water and decrease soda from 1 bottle to 1/2 bottle) in order to achieve improved stool consistency   Time 12   Period Weeks   Status On-going     PT LONG TERM GOAL #6   Title Pt will report improved stool consistency from Type 5-7 to 4 about 75% of the time in order to ipe less 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 Anticipate pt 's loose stools to improve with increased intraabdominal pressure system. Progressed today to pelvic floor endurance strengthen as pt has shown improved proper pelvic floor ROM and coordination. Pt demo'd low endurance of pelvis mm. Initiated colon massage as well to promote mobility. Pt plans to return food diary to next session. Pt continues to benefit from skilled PT.    Rehab Potential Good   PT Frequency 1x / week   PT Duration 12 weeks   PT Treatment/Interventions ADLs/Self Care Home Management;Patient/family education;Stair training;Functional mobility training;Therapeutic activities;Therapeutic exercise;Balance training;Neuromuscular re-education;Cognitive remediation;Moist Heat;Taping   PT Next Visit Plan assess inguinal hernia    Consulted and Agree with Plan of Care Patient      Patient will benefit from skilled therapeutic intervention in order to improve the following deficits and impairments:  Improper body mechanics, Decreased range of motion, Decreased coordination, Postural dysfunction, Impaired flexibility, Decreased strength, Decreased endurance, Impaired sensation, Pain, Decreased activity tolerance, Decreased balance, Decreased mobility, Difficulty walking  Visit Diagnosis: Other lack of coordination  Muscle weakness (generalized)  Problem List There are no active problems to display for this patient.   Jerl Mina ,PT, DPT, E-RYT  02/17/2017, 9:00 AM  [place] MAIN [facility] SERVICES [address] [place], [place], [address] Phone: [phone]   Fax:  [phone]  Name: KYRON SCHLITT MRN: [id number] Date of Birth: 31-May-1956

## 2017-02-17 NOTE — Patient Instructions (Addendum)
Deep core level 2 ( no wobbling of hips and spine   PELVIC FLOOR / KEGEL EXERCISES   Pelvic floor/ Kegel exercises are used to strengthen the muscles in the base of your pelvis that are responsible for supporting your pelvic organs and preventing urine/feces leakage. Based on your therapist's recommendations, they can be performed while standing, sitting, or lying down.  Make yourself aware of this muscle group by using these cues:  Imagine you are in a crowded room and you feel the need to pass gas. Your response is to pull up and in at the rectum.  Close the rectum. Pull the muscles up inside your body,feeling your scrotum lifting as well . Feel the pelvic floor muscles lift as if you were walking into a cold lake.  Place your hand on top of your pubic bone. Tighten and draw in the muscles around the anal muscles without squeezing the buttock muscles.  Common Errors:  Breath holding: If you are holding your breath, you may be bearing down against your bladder instead of pulling it up. If you belly bulges up while you are squeezing, you are holding your breath. Be sure to breathe gently in and out while exercising. Counting out loud may help you avoid holding your breath.  Accessory muscle use: You should not see or feel other muscle movement when performing pelvic floor exercises. When done properly, no one can tell that you are performing the exercises. Keep the buttocks, belly and inner thighs relaxed.  Overdoing it: Your muscles can fatigue and stop working for you if you over-exercise. You may actually leak more or feel soreness at the lower abdomen or rectum.  YOUR HOME EXERCISE PROGRAM  LONG HOLDS: Position: on back  Inhale and then exhale. Then squeeze the muscle and count aloud for 3seconds. Rest with three long breaths. (Be sure to let belly sink in with exhales and not push outward)  Perform 7 repetitions, 2 times/day     **ALSO SQUEEZE BEFORE YOUR SNEEZE, COUGH, LAUGH  to decrease downward pressure   **ALSO EXHALE BEFORE YOU RISE AGAINST GRAVITY (lifting, sit to stand, from squat to stand)    __________   Belly massage: Clockwise 5 reps, gentle pressure

## 2017-03-03 ENCOUNTER — Encounter: Payer: Self-pay | Admitting: Physical Therapy

## 2017-03-17 ENCOUNTER — Ambulatory Visit: Payer: Managed Care, Other (non HMO) | Attending: Gastroenterology | Admitting: Physical Therapy

## 2017-03-17 DIAGNOSIS — M6281 Muscle weakness (generalized): Secondary | ICD-10-CM | POA: Diagnosis present

## 2017-03-17 DIAGNOSIS — R278 Other lack of coordination: Secondary | ICD-10-CM | POA: Diagnosis present

## 2017-03-17 NOTE — Therapy (Signed)
[place] MAIN [facility] SERVICES [address] [place], [place], [address] Phone: [phone]   Fax:  [phone]  Physical Therapy Treatment / Progress Note  Patient Details  Name: Adrian Alvarado MRN: [id number] Date of Birth: 09-25-1955 Referring Provider: Gustavo Lah   Encounter Date: 03/17/2017      PT End of Session - 03/17/17 0837    Visit Number 6   Number of Visits 12   Date for PT Re-Evaluation 03/31/17   PT Start Time 0815   PT Stop Time 0911   PT Time Calculation (min) 56 min   Activity Tolerance Patient tolerated treatment well;No increased pain   Behavior During Therapy WFL for tasks assessed/performed      Past Medical History:  Diagnosis Date  . DDD (degenerative disc disease), lumbar   . Dermatitis   . GERD (gastroesophageal reflux disease)   . Hyperlipidemia   . IBS (irritable bowel syndrome)   . Nephrolithiasis   . Sleep apnea    VPAP  . Thrombocytopenia ([place])    mild    Past Surgical History:  Procedure Laterality Date  . APPENDECTOMY    . COLONOSCOPY    . ESOPHAGOGASTRODUODENOSCOPY (EGD) WITH PROPOFOL N/A 11/27/2015   Procedure: ESOPHAGOGASTRODUODENOSCOPY (EGD) WITH PROPOFOL;  Surgeon: Hulen Luster, MD;  Location: [facility] ENDOSCOPY;  Service: Gastroenterology;  Laterality: N/A;  . EYE SURGERY Right 01/2007   extraction cataract extracapsular  . HERNIA REPAIR Right early 2000s   inguinal     There were no vitals filed for this visit.      Subjective Assessment - 03/17/17 0818    Subjective Pt returns to PT after 4 weeks. Pt reports 50% improvement with leakage after performing the exercises. Pt also notice that his stools are getting more solid.    Pertinent History Pt was Dx with IBS. Dx with hernial repair (early 2000). Family Hx of colon and lung CA. Last colonoscopy within the last 5 years with polyps removed. Fluid intake: not sure how much water, tea (2-3 glasses), coffee (1 cup), soda (16 fl oz).              [facility] PT Assessment - 03/17/17 0833      Coordination   Gross Motor Movements are Fluid and Coordinated --  cued for correct technique deep core level 3,.                  Pelvic Floor Special Questions - 03/17/17 0001    Diastasis Recti 1 fingers width above umbilicus pre/post head crunch   External Perineal Exam through clothing.   External Palpation progressed to seated 3 sec, 5 reps. moderate cues for less chest breathing             [facility] Adult PT Treatment/Exercise - 03/17/17 0856      Therapeutic Activites    Therapeutic Activities --  see pt instructions     Neuro Re-ed    Neuro Re-ed Details  see pt instructions                PT Education - 03/17/17 1059    Education provided Yes   Education Details HEP   Person(s) Educated Patient   Methods Explanation;Demonstration;Tactile cues;Verbal cues;Handout   Comprehension Returned demonstration;Verbalized understanding             PT Long Term Goals - 03/17/17 0839      PT LONG TERM GOAL #1   Title Pt will demo decrease abdominal  separation from 4 fingers above umbilicus to < 2 fingers width in order to improve intraabdominal pressure for motility and postural stability for ADLs   Time 12   Period Weeks   Status Achieved     PT LONG TERM GOAL #2   Title Pt will decrease his COREFO score from 23% to < 18% in order to improve pelvic floor function and QOL   Time 12   Period Weeks   Status On-going     PT LONG TERM GOAL #3   Title Pt will demo increased spinal ROM from 25% in rotation/ sideflexion B to full range in order to minimize CLBP and perform his overhead reaching, lifting with minimal risk for injuries at work and home    Time 12   Period Weeks   Status Achieved     PT LONG TERM GOAL #4   Title Pt will demo proper pelvic floor coordination in order to eliminate completely    Time 12   Period Weeks   Status Achieved     PT LONG TERM GOAL #5   Title Pt will  be compliant with proper water to bladder irritant ratio (increase water intake by 2-3 glasses / day instead of minimal water and decrease soda from 1 bottle to 1/2 bottle) in order to achieve improved stool consistency   Time 12   Period Weeks   Status Achieved     Additional Long Term Goals   Additional Long Term Goals Yes     PT LONG TERM GOAL #6   Title Pt will report improved stool consistency from Type 5-7 to 4 about 75% of the time 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 Pt reports better formed stools ( Bristol Stool Type 4 ) 75% of the time. Pt also states he does not have to wipe as frequently.  Pt is not using a toilet paper to plug up his anus from daily to 1-2x/ week in order to avoid changing this undergarments.  Pt was educated to proper hygiene and avoid the use of toilet paper.  Pt shows significantly improved diastasis recti and less abdominal bulging at his umbilical hernia.  Pt also has progresed to upright pelvic floor strengthening today along with the next level of the deep core strengthening in order to promote a better intraabdominal pressure system for motility  and spinal stability. Pt also incorporated more water and less soda in his diet. Pt was recommended general ways to incorporate more fiber with use of less processed foods and more whole foods based on his food diary.  Pt has achieved  4 /7   goals and will advanced towards his remaining goals with skilled PT.     Rehab Potential Good   PT  Frequency 1x / week   PT Duration 12 weeks   PT Treatment/Interventions ADLs/Self  Care Home Management;Patient/family education;Stair training;Functional mobility training;Therapeutic activities;Therapeutic exercise;Balance training;Neuromuscular re-education;Cognitive remediation;Moist Heat;Taping   PT Next Visit Plan --   Consulted and Agree with Plan of Care Patient      Patient will benefit from skilled therapeutic intervention in order to improve the following deficits and impairments:  Improper body mechanics, Decreased range of motion, Decreased coordination, Postural dysfunction, Impaired flexibility, Decreased strength, Decreased endurance, Impaired sensation, Pain, Decreased activity tolerance, Decreased balance, Decreased mobility, Difficulty walking  Visit Diagnosis: Other lack of coordination  Muscle weakness (generalized)     Problem List There are no active problems to display for this patient.   Jerl Mina ,PT, DPT, E-RYT  03/17/2017, 11:00 AM  [place] MAIN [facility] SERVICES [address] [place], [place], [address] Phone: [phone]   Fax:  [phone]  Name: JHOVANI GRISWOLD MRN: [id number] Date of Birth: November 05, 1955

## 2017-03-17 NOTE — Patient Instructions (Addendum)
We reviewed your food diary:   General ways to incorporate more fiber    _ breakfast: variate your food from day to day , don't stick with the same breakfast daily. Incorporate oatmeal every other day, with prunes, dates, raisins, apples (can have a few pieces of one or two cut up pieces in each bowl)   _ lunch and dinner: good job! Make sure each plate is colorful with greens, orange, yellow veggie items like carrots, yellow squash, etc   _ snacks: incorporate carrot sticks and celery sticks    * OVERALL, less processed foods and more wholesome foods   ____________________   Instead of the deep core level 2( knee out) --> progress to  Deep core level 3 ( lifting foot up ~1 inch without pooching belly nor arching back)  6 min or 30 reps toal 2x day    Instead of laying on side for pelvic floor long holds, Progress to seated posture:  Warm-up:  5-10 reps of midback arches with palsm by hips to loosen midback  Seated pelvic floor long holds 3 sec, 5 reps , at 5 different times of the day in proper seated position

## 2017-04-04 ENCOUNTER — Encounter: Payer: Self-pay | Admitting: Physical Therapy

## 2017-04-21 ENCOUNTER — Encounter: Payer: Self-pay | Admitting: Physical Therapy

## 2017-04-21 ENCOUNTER — Ambulatory Visit: Payer: Managed Care, Other (non HMO) | Attending: Gastroenterology | Admitting: Physical Therapy

## 2017-04-21 DIAGNOSIS — M6281 Muscle weakness (generalized): Secondary | ICD-10-CM

## 2017-04-21 DIAGNOSIS — R278 Other lack of coordination: Secondary | ICD-10-CM

## 2017-04-21 NOTE — Therapy (Signed)
[place] MAIN [facility] SERVICES [address] [place], [place], [address] Phone: [phone]   Fax:  [phone]  Patient Details  Name: Adrian Alvarado MRN: [id number] Date of Birth: August 15, 1956 Referring Provider:    Encounter Date: 04/21/2017   Discharge Summary    Pt  reports 50% improvement with his fecal leakage after 6 visits. Pt reports better formed stools ( Bristol Stool Type 4 ) 75% of the time. Pt also states he does not have to wipe as frequently.  Pt is not using a toilet paper to plug up his anus from daily to 1-2x/ week in order to avoid changing this undergarments.  Pt was educated to proper hygiene and avoid the use of toilet paper.  Pt showed significantly improved diastasis recti and less abdominal bulging at his umbilical hernia.  Pt also has progressed to upright pelvic floor strengthening today along with the next level of the deep core strengthening in order to promote a better intraabdominal pressure system for motility  and spinal stability. Pt also incorporated more water and less soda in his diet. Pt was recommended general ways to incorporate more fiber with use of less processed foods and more whole foods based on his food diary.  Pt has achieved  4 /7  goals and is ready for d/c. Pt reports he plans to continue with his HEP.    Jerl Mina ,PT, DPT, E-RYT  04/21/2017, 10:06 AM  [place] MAIN [facility] SERVICES [address] [place], [place], [address] Phone: [phone]   Fax:  [phone]

## 2017-05-05 ENCOUNTER — Ambulatory Visit: Payer: Managed Care, Other (non HMO) | Admitting: Physical Therapy

## 2017-06-20 ENCOUNTER — Encounter: Payer: Self-pay | Admitting: *Deleted

## 2017-06-23 ENCOUNTER — Encounter
Admission: RE | Disposition: A | Discharge status: Home / Self Care | Payer: Self-pay | Source: Ambulatory Visit | Attending: Gastroenterology

## 2017-06-23 ENCOUNTER — Encounter: Payer: Self-pay | Admitting: Anesthesiology

## 2017-06-23 ENCOUNTER — Ambulatory Visit
Admission: RE | Admit: 2017-06-23 | Discharge: 2017-06-23 | Disposition: A | Discharge status: Home / Self Care | Payer: Managed Care, Other (non HMO) | Source: Ambulatory Visit | Attending: Gastroenterology | Admitting: Gastroenterology

## 2017-06-23 ENCOUNTER — Ambulatory Visit: Payer: Managed Care, Other (non HMO) | Admitting: Anesthesiology

## 2017-06-23 DIAGNOSIS — Z7982 Long term (current) use of aspirin: Secondary | ICD-10-CM | POA: Insufficient documentation

## 2017-06-23 DIAGNOSIS — Z79899 Other long term (current) drug therapy: Secondary | ICD-10-CM | POA: Insufficient documentation

## 2017-06-23 DIAGNOSIS — K621 Rectal polyp: Secondary | ICD-10-CM | POA: Diagnosis not present

## 2017-06-23 DIAGNOSIS — D696 Thrombocytopenia, unspecified: Secondary | ICD-10-CM | POA: Insufficient documentation

## 2017-06-23 DIAGNOSIS — Z1211 Encounter for screening for malignant neoplasm of colon: Secondary | ICD-10-CM | POA: Insufficient documentation

## 2017-06-23 DIAGNOSIS — K529 Noninfective gastroenteritis and colitis, unspecified: Secondary | ICD-10-CM | POA: Diagnosis not present

## 2017-06-23 DIAGNOSIS — D128 Benign neoplasm of rectum: Secondary | ICD-10-CM | POA: Insufficient documentation

## 2017-06-23 DIAGNOSIS — G4730 Sleep apnea, unspecified: Secondary | ICD-10-CM | POA: Diagnosis not present

## 2017-06-23 DIAGNOSIS — E785 Hyperlipidemia, unspecified: Secondary | ICD-10-CM | POA: Diagnosis not present

## 2017-06-23 DIAGNOSIS — Z86010 Personal history of colonic polyps: Secondary | ICD-10-CM | POA: Insufficient documentation

## 2017-06-23 DIAGNOSIS — Z888 Allergy status to other drugs, medicaments and biological substances status: Secondary | ICD-10-CM | POA: Insufficient documentation

## 2017-06-23 DIAGNOSIS — K219 Gastro-esophageal reflux disease without esophagitis: Secondary | ICD-10-CM | POA: Diagnosis not present

## 2017-06-23 HISTORY — PX: COLONOSCOPY WITH PROPOFOL: SHX5780

## 2017-06-23 LAB — CBC WITH DIFFERENTIAL/PLATELET
BASOS ABS: 0 10*3/uL (ref 0–0.1)
BASOS PCT: 1 %
Eosinophils Absolute: 0 10*3/uL (ref 0–0.7)
Eosinophils Relative: 1 %
HEMATOCRIT: 44.5 % (ref 40.0–52.0)
HEMOGLOBIN: 15.2 g/dL (ref 13.0–18.0)
Lymphocytes Relative: 28 %
Lymphs Abs: 1.6 10*3/uL (ref 1.0–3.6)
MCH: 30.3 pg (ref 26.0–34.0)
MCHC: 34.1 g/dL (ref 32.0–36.0)
MCV: 88.9 fL (ref 80.0–100.0)
MONOS PCT: 9 %
Monocytes Absolute: 0.5 10*3/uL (ref 0.2–1.0)
NEUTROS ABS: 3.5 10*3/uL (ref 1.4–6.5)
NEUTROS PCT: 61 %
Platelets: 138 10*3/uL — ABNORMAL LOW (ref 150–440)
RBC: 5.01 MIL/uL (ref 4.40–5.90)
RDW: 13.4 % (ref 11.5–14.5)
WBC: 5.7 10*3/uL (ref 3.8–10.6)

## 2017-06-23 LAB — PROTIME-INR
INR: 1.07
Prothrombin Time: 13.8 seconds (ref 11.4–15.2)

## 2017-06-23 SURGERY — COLONOSCOPY WITH PROPOFOL
Anesthesia: General

## 2017-06-23 MED ORDER — PROPOFOL 10 MG/ML IV BOLUS
INTRAVENOUS | Status: DC | PRN
  Administered 2017-06-23: 50 mg via INTRAVENOUS
  Administered 2017-06-23: 20 mg via INTRAVENOUS

## 2017-06-23 MED ORDER — LIDOCAINE HCL (PF) 1 % IJ SOLN
2.0000 mL | Freq: Once | INTRAMUSCULAR | Status: AC
  Administered 2017-06-23: 0.3 mL via INTRADERMAL

## 2017-06-23 MED ORDER — SODIUM CHLORIDE 0.9 % IV SOLN: INTRAVENOUS | Status: DC

## 2017-06-23 MED ORDER — LIDOCAINE HCL (PF) 1 % IJ SOLN
INTRAMUSCULAR | Status: AC
  Administered 2017-06-23: 0.3 mL via INTRADERMAL
  Filled 2017-06-23: qty 2

## 2017-06-23 MED ORDER — PROPOFOL 500 MG/50ML IV EMUL
INTRAVENOUS | Status: DC | PRN
  Administered 2017-06-23: 75 ug/kg/min via INTRAVENOUS

## 2017-06-23 MED ORDER — PROPOFOL 500 MG/50ML IV EMUL
INTRAVENOUS | Status: AC
  Filled 2017-06-23: qty 50

## 2017-06-23 MED ORDER — SODIUM CHLORIDE 0.9 % IV SOLN
INTRAVENOUS | Status: DC
  Administered 2017-06-23: 09:00:00 via INTRAVENOUS
  Administered 2017-06-23: 1000 mL via INTRAVENOUS

## 2017-06-23 NOTE — Anesthesia Preprocedure Evaluation (Addendum)
Anesthesia Evaluation  Patient identified by MRN, date of birth, ID band Patient awake    Reviewed: Allergy & Precautions, NPO status , Patient's Chart, lab work & pertinent test results, reviewed documented beta blocker date and time   Airway Mallampati: II  TM Distance: >3 FB     Dental  (+) Chipped   Pulmonary sleep apnea , former smoker,           Cardiovascular      Neuro/Psych    GI/Hepatic GERD  ,  Endo/Other    Renal/GU Renal disease     Musculoskeletal  (+) Arthritis ,   Abdominal   Peds  Hematology   Anesthesia Other Findings   Reproductive/Obstetrics                            Anesthesia Physical Anesthesia Plan  ASA: III  Anesthesia Plan: General   Post-op Pain Management:    Induction: Intravenous  PONV Risk Score and Plan:   Airway Management Planned:   Additional Equipment:   Intra-op Plan:   Post-operative Plan:   Informed Consent: I have reviewed the patients History and Physical, chart, labs and discussed the procedure including the risks, benefits and alternatives for the proposed anesthesia with the patient or authorized representative who has indicated his/her understanding and acceptance.     Plan Discussed with: CRNA  Anesthesia Plan Comments:         Anesthesia Quick Evaluation

## 2017-06-23 NOTE — Transfer of Care (Signed)
Immediate Anesthesia Transfer of Care Note  Patient: Adrian Alvarado  Procedure(s) Performed: COLONOSCOPY WITH PROPOFOL (N/A )  Patient Location: PACU and Endoscopy Unit  Anesthesia Type:General  Level of Consciousness: awake  Airway & Oxygen Therapy: Patient Spontanous Breathing  Post-op Assessment: Report given to RN and Post -op Vital signs reviewed and stable  Post vital signs: Reviewed and stable  Last Vitals:  Vitals:   06/23/17 0742  BP: 114/79  Pulse: 62  Resp: 17  Temp: (!) 35.9 C  SpO2: 100%    Last Pain:  Vitals:   06/23/17 0742  TempSrc: Tympanic         Complications: No apparent anesthesia complications

## 2017-06-23 NOTE — Transfer of Care (Signed)
Immediate Anesthesia Transfer of Care Note  Patient: Adrian Alvarado  Procedure(s) Performed: COLONOSCOPY WITH PROPOFOL (N/A )  Patient Location: PACU and Endoscopy Unit  Anesthesia Type:General  Level of Consciousness: awake, alert  and oriented  Airway & Oxygen Therapy: Patient Spontanous Breathing and Patient connected to nasal cannula oxygen  Post-op Assessment: Report given to RN and Post -op Vital signs reviewed and stable  Post vital signs: Reviewed and stable  Last Vitals:  Vitals:   06/23/17 0742 06/23/17 0921  BP: 114/79   Pulse: 62   Resp: 17   Temp: (!) 35.9 C (!) (P) 35.8 C  SpO2: 100%     Last Pain:  Vitals:   06/23/17 0921  TempSrc: (P) Tympanic         Complications: No apparent anesthesia complications

## 2017-06-23 NOTE — H&P (Signed)
Outpatient short stay form Pre-procedure 06/23/2017 8:46 AM Lollie Sails MD  Primary Physician: Dr. Genene Churn  Reason for visit:  Colonoscopy  History of present illness:  Patient is a 61 year old male presenting today as above. 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 He did tolerate his prep well. He does take a 325 mg aspirin daily but is held that for about a week. He takes no other aspirin products or blood thinning agents. There is a history of thrombocytopenia and his platelet count today was 138. His hemogram was otherwise normal. His pro time today showed INR 1.07. He tolerated his prep well. His last colonoscopy was 02/04/2013 with finding of the adenoma in the rectum at that time. He has had a recommendation for biofeedback training for pelvic floor.    Current Facility-Administered Medications:  .  0.9 %  sodium chloride infusion, , Intravenous, Continuous, Lollie Sails, MD, Last Rate: 20 mL/hr at 06/23/17 0825, 1,000 mL at 06/23/17 0825 .  0.9 %  sodium chloride infusion, , Intravenous, Continuous, Lollie Sails, MD  Prescriptions Prior to Admission  Medication Sig Dispense Refill Last Dose  . aspirin 325 MG tablet Take 325 mg by mouth daily.   Past Week at Unknown time  . fluconazole (DIFLUCAN) 200 MG tablet Take 200 mg by mouth daily.     . metroNIDAZOLE (METROGEL) 0.75 % gel Apply 1 application topically 2 (two) times daily.     Marland Kitchen saccharomyces boulardii (FLORASTOR) 250 MG capsule Take 250 mg by mouth 2 (two) times daily.     . Cholecalciferol 5000 units TABS Take by mouth.   Not Taking  . Crisaborole (EUCRISA) 2 % OINT Apply topically.   Taking  . CYANOCOBALAMIN PO Take by mouth.   Taking  . esomeprazole (NEXIUM) 40 MG capsule Take 40 mg by mouth daily at 12 noon.   Taking  . Iodoquinol-Aloe Polysaccharide (ALOQUIN) 1.25-1 % GEL Apply topically.   Not Taking  . Iodoquinol-HC-Aloe  Polysacch (ALCORTIN EX) Apply topically.   Taking  . magnesium oxide (MAG-OX) 400 MG tablet Take 400 mg by mouth daily.   Not Taking  . miconazole (MICOTIN) 2 % powder Apply topically as needed for itching.   Taking  . mupirocin cream (BACTROBAN) 2 % Apply 1 application topically 2 (two) times daily. Until healed left hand laceration 15 g 0 Taking  . polycarbophil (FIBERCON) 625 MG tablet Take 625 mg by mouth daily.   Taking  . Probiotic Product (PROBIOTIC ADVANCED PO) Take by mouth.   Taking  . sulfamethoxazole-trimethoprim (BACTRIM DS,SEPTRA DS) 800-160 MG tablet Take 1 tablet by mouth 2 (two) times daily. (Patient not taking: Reported on 01/06/2017) 14 tablet 0 Not Taking  . tacrolimus (PROTOPIC) 0.1 % ointment Apply topically 2 (two) times daily.   Not Taking     Allergies  Allergen Reactions  . Bentyl [Dicyclomine Hcl]      Past Medical History:  Diagnosis Date  . DDD (degenerative disc disease), lumbar   . Dermatitis   . GERD (gastroesophageal reflux disease)   . Hyperlipidemia   . IBS (irritable bowel syndrome)   . Nephrolithiasis   . Sleep apnea    VPAP  . Thrombocytopenia ([facility])    mild    Review of systems:      Physical Exam    Heart and lungs: Regular rate and rhythm without rub or gallop, lungs are bilaterally clear.  HEENT: Normocephalic atraumatic eyes are anicteric    Other: Soft nontender nondistended bowel sounds positive normoactive.    Pertinant exam for procedure: Soft nontender nondistended bowel sounds positive normoactive  Planned proceedures: Colonoscopy and indicated procedures. I have discussed the risks benefits and complications of procedures to include not limited to bleeding, infection, perforation and the risk of sedation and the patient wishes to proceed.    Lollie Sails, MD Gastroenterology 06/23/2017  8:46 AM

## 2017-06-23 NOTE — Op Note (Signed)
[facility] Gastroenterology Patient Name: Adrian Alvarado Procedure Date: 06/23/2017 8:48 AM MRN: [id number] Account #: [id number] Date of Birth: 1956/07/02 Admit Type: Outpatient Age: 61 Room: [facility] ENDO ROOM 1 Gender: Male Note Status: Finalized Procedure:            Colonoscopy Indications:          Personal history of colonic polyps Providers:            Lollie Sails, MD Referring MD:         Christena Flake. Raechel Ache, MD (Referring MD) Complications:        No immediate complications. Procedure:            Pre-Anesthesia Assessment:                       - ASA Grade Assessment: III - A patient with severe                        systemic disease.                       After obtaining informed consent, the colonoscope was                        passed under direct vision. Throughout the procedure,                        the patient's blood pressure, pulse, and oxygen                        saturations were monitored continuously. The Olympus                        PCF-H180AL colonoscope ( S#: [id number] ) was introduced                        through the anus and advanced to the the cecum,                        identified by appendiceal orifice and ileocecal valve.                        The colonoscopy was performed without difficulty. The                        colonoscopy was performed without difficulty. The                        patient tolerated the procedure well. The quality of                        the bowel preparation was good. Findings:      A 10 mm polyp was found in the rectum. The polyp was sessile. The polyp       was removed with a cold snare. Resection and retrieval were complete.      Three sessile polyps were found in the rectum. The polyps were 1 to 2 mm       in size. These polyps were removed with a cold biopsy forceps. Resection       and retrieval were complete.  Biopsies for histology were taken with a cold forceps from the right   colon and left colon for evaluation of microscopic colitis.      The retroflexed view of the distal rectum and anal verge was normal and       showed no anal or rectal abnormalities.      The digital rectal exam was normal. Impression:           - One 10 mm polyp in the rectum, removed with a cold                        snare. Resected and retrieved.                       - Three 1 to 2 mm polyps in the rectum, removed with a                        cold biopsy forceps. Resected and retrieved.                       - The distal rectum and anal verge are normal on                        retroflexion view.                       - Biopsies were taken with a cold forceps from the                        right colon and left colon for evaluation of                        microscopic colitis. Recommendation:       - Discharge patient to home.                       - Telephone GI clinic for pathology results in 1 week. Procedure Code(s):    --- Professional ---                       [phone], Colonoscopy, flexible; with removal of tumor(s),                        polyp(s), or other lesion(s) by snare technique                       45380, 67, Colonoscopy, flexible; with biopsy, single                        or multiple Diagnosis Code(s):    --- Professional ---                       K62.1, Rectal polyp                       Z86.010, Personal history of colonic polyps CPT copyright 2016 American Medical Association. All rights reserved. The codes documented in this report are preliminary and upon coder review may  be revised to meet current compliance requirements. Lollie Sails, MD 06/23/2017 9:18:54 AM This report has been signed electronically. Number of Addenda: 0 Note Initiated On: 06/23/2017  8:48 AM Scope Withdrawal Time: 0 hours 13 minutes 45 seconds  Total Procedure Duration: 0 hours 18 minutes 31 seconds       [facility]

## 2017-06-23 NOTE — Anesthesia Post-op Follow-up Note (Signed)
Anesthesia QCDR form completed.        

## 2017-06-23 NOTE — Anesthesia Postprocedure Evaluation (Signed)
Anesthesia Post Note  Patient: Adrian Alvarado  Procedure(s) Performed: COLONOSCOPY WITH PROPOFOL (N/A )  Patient location during evaluation: Endoscopy Anesthesia Type: General Level of consciousness: awake and alert Pain management: pain level controlled Vital Signs Assessment: post-procedure vital signs reviewed and stable Respiratory status: spontaneous breathing, nonlabored ventilation, respiratory function stable and patient connected to nasal cannula oxygen Cardiovascular status: blood pressure returned to baseline and stable Postop Assessment: no apparent nausea or vomiting Anesthetic complications: no     Last Vitals:  Vitals:   06/23/17 0941 06/23/17 0951  BP: 127/84 133/80  Pulse: (!) 56 (!) 56  Resp: 13 14  Temp:    SpO2: 97% 98%    Last Pain:  Vitals:   06/23/17 0921  TempSrc: Tympanic                 Kavish Lafitte S

## 2017-06-24 ENCOUNTER — Encounter: Payer: Self-pay | Admitting: Gastroenterology

## 2017-06-24 LAB — SURGICAL PATHOLOGY

## 2019-12-01 ENCOUNTER — Ambulatory Visit (INDEPENDENT_AMBULATORY_CARE_PROVIDER_SITE_OTHER): Payer: Medicare Other | Admitting: Dermatology

## 2019-12-01 ENCOUNTER — Other Ambulatory Visit: Payer: Self-pay

## 2019-12-01 DIAGNOSIS — L304 Erythema intertrigo: Secondary | ICD-10-CM | POA: Diagnosis not present

## 2019-12-01 MED ORDER — PIMECROLIMUS 1 % EX CREA: TOPICAL_CREAM | CUTANEOUS | 6 refills | Status: DC

## 2019-12-01 NOTE — Progress Notes (Signed)
   Follow-Up Visit   Subjective  Adrian Alvarado is a 64 y.o. male who presents for the following: Follow-up (Intertrigo, groin, using Eucrias qod alternating with Alcortin A qod,  pts insurance doesnt cover Vytone and no longer covers [place]).  The following portions of the chart were reviewed this encounter and updated as appropriate:     Review of Systems: No other skin or systemic complaints.  Objective  Well appearing patient in no apparent distress; mood and affect are within normal limits.  A focused examination was performed including groin, perianal. Relevant physical exam findings are noted in the Assessment and Plan.  Objective  groin: No erythema, no rash in groin, perianal area clear  Assessment & Plan  Intertrigo groin  With pruritus and stinging and burning.  Currently calm and controlled.  Start Intertrigo mix (Iodoquinol 1%, Hydrocortisone 2.5%, Niacinamide 2% cream) from [employer] [facility] qod aa groin prn flares 30g 4rf  Start Elidel cream qod aa (alternating with above mix)  If Elidel not covered will send in Protopic 1% oint  Pt given information on [employer] [facility]  Pt advised his condition is treatable but not curable and can be controlled.  He feels he is controlled on treatment at this time.  pimecrolimus (ELIDEL) 1 % cream - groin  Return in about 6 months (around 06/02/2020) for Intertrigo.

## 2019-12-02 ENCOUNTER — Other Ambulatory Visit: Payer: Self-pay

## 2019-12-02 MED ORDER — TACROLIMUS 0.1 % EX OINT: TOPICAL_OINTMENT | CUTANEOUS | 2 refills | Status: DC

## 2019-12-02 NOTE — Progress Notes (Signed)
Patient called stating Pimecrolimus was not covered. Per office note OK to switch to Tacrolimus Ointment.

## 2019-12-17 ENCOUNTER — Other Ambulatory Visit: Payer: Self-pay | Admitting: Dermatology

## 2019-12-29 ENCOUNTER — Other Ambulatory Visit: Payer: Self-pay | Admitting: Dermatology

## 2020-06-01 ENCOUNTER — Ambulatory Visit (INDEPENDENT_AMBULATORY_CARE_PROVIDER_SITE_OTHER): Payer: Medicare Other | Admitting: Dermatology

## 2020-06-01 ENCOUNTER — Other Ambulatory Visit: Payer: Self-pay

## 2020-06-01 DIAGNOSIS — L821 Other seborrheic keratosis: Secondary | ICD-10-CM | POA: Diagnosis not present

## 2020-06-01 DIAGNOSIS — L304 Erythema intertrigo: Secondary | ICD-10-CM | POA: Diagnosis not present

## 2020-06-01 MED ORDER — FLUCONAZOLE 200 MG PO TABS: ORAL_TABLET | ORAL | 1 refills | Status: DC

## 2020-06-01 NOTE — Progress Notes (Signed)
   Follow-Up Visit   Subjective  Adrian Alvarado is a 64 y.o. male who presents for the following: Rash (6 months f/u on Intertrigo in the groin treating with [facility] ). Check a spot on the foot pt recently noticed.  The following portions of the chart were reviewed this encounter and updated as appropriate: Allergies  Meds  Problems  Med Hx  Surg Hx  Fam Hx     Review of Systems: No other skin or systemic complaints except as noted in HPI or Assessment and Plan.  Objective  Well appearing patient in no apparent distress; mood and affect are within normal limits.  A focused examination was performed including groin, R ankle . Relevant physical exam findings are noted in the Assessment and Plan.  Objective  groin: Groin is clear   Objective  Right medial ankle: Stuck-on, waxy, tan-brown papule or plaque --Discussed benign etiology and prognosis.   Assessment & Plan  Intertrigo groin Better controlled. Advised treatable, but not curable and may have flares. Cont Fluoconazole 200 mg take 1 tablet once a week #4 1 RF (may use as needed for flares)  Cont [facility] Intertrigo mix (Iodoquinol 1%, Hydrocortisone 2.5%, Niacinamide 2% cream) from [facility] qod aa groin prn flares    Pt advised his condition is treatable but not curable and can be controlled.  He feels he is controlled on treatment at this time.   We will contact [facility] to see if they can make a compound topical like Eucrisa ointment, Elidel cream or Protopic ointment the pt may try for flares and itch  Reordered Medications fluconazole (DIFLUCAN) 200 MG tablet  Other Related Medications pimecrolimus (ELIDEL) 1 % cream  Seborrheic keratosis Right medial ankle  Benign-appearing.  Observation.  Call clinic for new or changing moles.  Recommend daily use of broad spectrum spf 30+ sunscreen to sun-exposed areas.    Return in about 6 months (around 11/29/2020).   IMarye Round,  CMA, am acting as scribe for Sarina Ser, MD .  Documentation: I have reviewed the above documentation for accuracy and completeness, and I agree with the above.  Sarina Ser, MD

## 2020-06-05 ENCOUNTER — Encounter: Payer: Self-pay | Admitting: Dermatology

## 2020-12-06 ENCOUNTER — Ambulatory Visit (INDEPENDENT_AMBULATORY_CARE_PROVIDER_SITE_OTHER): Payer: Medicare Other | Admitting: Dermatology

## 2020-12-06 ENCOUNTER — Other Ambulatory Visit: Payer: Self-pay

## 2020-12-06 DIAGNOSIS — L304 Erythema intertrigo: Secondary | ICD-10-CM

## 2020-12-06 NOTE — Progress Notes (Signed)
   Follow-Up Visit   Subjective  Adrian Alvarado is a 65 y.o. male who presents for the following: Intertrigo (Groin, [facility] Intertrigo mix qod, elidel qod, has worsened past 86m where he is using medication qd to qod, he had times where area was clear and was able to d/c meds).  The following portions of the chart were reviewed this encounter and updated as appropriate:   Allergies  Meds  Problems  Med Hx  Surg Hx  Fam Hx     Review of Systems:  No other skin or systemic complaints except as noted in HPI or Assessment and Plan.  Objective  Well appearing patient in no apparent distress; mood and affect are within normal limits.  A focused examination was performed including groin. Relevant physical exam findings are noted in the Assessment and Plan.  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  It is exacerbated by increased movement / activity; sweating; and higher atmospheric temperature.  Cont [facility] Intertrigo mix qod aa groin until clear, then prn flares Cont Elidel cr qod aa groin until rash clear, then prn flares Cont Zeasorb AF powder daily  Will check with [facility] to see if they can compound Elidel, Eucrisa or Protopic, if they dont have any formaltion, may send in the Itch cream from [facility]  Other Related Medications pimecrolimus (ELIDEL) 1 % cream fluconazole (DIFLUCAN) 200 MG tablet  Return in about 6 months (around 06/08/2021) for Intertigo.  I, Othelia Pulling, RMA, am acting as scribe for Sarina Ser, MD .  Documentation: I have reviewed the above documentation for accuracy and completeness, and I agree with the above.  Sarina Ser, MD

## 2020-12-06 NOTE — Patient Instructions (Signed)

## 2020-12-07 ENCOUNTER — Telehealth: Payer: Self-pay

## 2020-12-07 MED ORDER — TACROLIMUS 0.1 % EX OINT: TOPICAL_OINTMENT | CUTANEOUS | 3 refills | Status: DC

## 2020-12-07 NOTE — Telephone Encounter (Signed)
Left pt msg that [facility] can not formulate a medication similar to Eucrisa, Elidel, or Protopic.  Advised pt we would send Tacrolimus 0.1% oint to [place] to see if pt could get a cash price of $65 or less.  Advised pt to call if any questions./sh

## 2020-12-07 NOTE — Telephone Encounter (Signed)
-----   Message from Ralene Bathe, MD sent at 12/07/2020  1:35 PM EDT ----- Please call and let pt know. Yes - try Protopic at [facility]. ----- Message ----- From: Larence Penning, CMA Sent: 12/07/2020  12:50 PM EDT To: Ralene Bathe, MD  I spoke to [facility] and they do not have any compounds at this time similar to Eucrisa, Protopic, or Elidel.  If they come up with a compound they will let us know.    I did see at [facility] 0.1% ointment pt may can get it for $65 or less.  I could try sending that in.  Davy Pique

## 2020-12-11 ENCOUNTER — Encounter: Payer: Self-pay | Admitting: Dermatology

## 2021-04-09 ENCOUNTER — Ambulatory Visit: Payer: Medicare Other | Admitting: Anesthesiology

## 2021-04-09 ENCOUNTER — Ambulatory Visit
Admission: RE | Admit: 2021-04-09 | Discharge: 2021-04-09 | Disposition: A | Discharge status: Home / Self Care | Payer: Medicare Other | Attending: Gastroenterology | Admitting: Gastroenterology

## 2021-04-09 ENCOUNTER — Encounter
Admission: RE | Disposition: A | Discharge status: Home / Self Care | Payer: Self-pay | Source: Home / Self Care | Attending: Gastroenterology

## 2021-04-09 DIAGNOSIS — K640 First degree hemorrhoids: Secondary | ICD-10-CM | POA: Insufficient documentation

## 2021-04-09 DIAGNOSIS — Z888 Allergy status to other drugs, medicaments and biological substances status: Secondary | ICD-10-CM | POA: Insufficient documentation

## 2021-04-09 DIAGNOSIS — Z7984 Long term (current) use of oral hypoglycemic drugs: Secondary | ICD-10-CM | POA: Diagnosis not present

## 2021-04-09 DIAGNOSIS — Z7982 Long term (current) use of aspirin: Secondary | ICD-10-CM | POA: Insufficient documentation

## 2021-04-09 DIAGNOSIS — K635 Polyp of colon: Secondary | ICD-10-CM | POA: Insufficient documentation

## 2021-04-09 DIAGNOSIS — Z87891 Personal history of nicotine dependence: Secondary | ICD-10-CM | POA: Diagnosis not present

## 2021-04-09 DIAGNOSIS — Z79899 Other long term (current) drug therapy: Secondary | ICD-10-CM | POA: Diagnosis not present

## 2021-04-09 DIAGNOSIS — Z09 Encounter for follow-up examination after completed treatment for conditions other than malignant neoplasm: Secondary | ICD-10-CM | POA: Diagnosis present

## 2021-04-09 HISTORY — PX: COLONOSCOPY WITH PROPOFOL: SHX5780

## 2021-04-09 LAB — GLUCOSE, CAPILLARY: Glucose-Capillary: 81 mg/dL (ref 70–99)

## 2021-04-09 SURGERY — COLONOSCOPY WITH PROPOFOL
Anesthesia: General

## 2021-04-09 MED ORDER — PROPOFOL 500 MG/50ML IV EMUL
INTRAVENOUS | Status: DC | PRN
  Administered 2021-04-09: 50 ug/kg/min via INTRAVENOUS

## 2021-04-09 MED ORDER — LIDOCAINE HCL (CARDIAC) PF 100 MG/5ML IV SOSY
PREFILLED_SYRINGE | INTRAVENOUS | Status: DC | PRN
  Administered 2021-04-09: 50 mg via INTRAVENOUS

## 2021-04-09 MED ORDER — PROPOFOL 500 MG/50ML IV EMUL
INTRAVENOUS | Status: AC
  Filled 2021-04-09: qty 50

## 2021-04-09 MED ORDER — SODIUM CHLORIDE 0.9 % IV SOLN
INTRAVENOUS | Status: DC
  Administered 2021-04-09: 20 mL/h via INTRAVENOUS

## 2021-04-09 MED ORDER — GLYCOPYRROLATE 0.2 MG/ML IJ SOLN
INTRAMUSCULAR | Status: DC | PRN
  Administered 2021-04-09 (×2): .2 mg via INTRAVENOUS

## 2021-04-09 MED ORDER — FENTANYL CITRATE (PF) 100 MCG/2ML IJ SOLN
INTRAMUSCULAR | Status: DC | PRN
  Administered 2021-04-09 (×2): 50 ug via INTRAVENOUS

## 2021-04-09 MED ORDER — FENTANYL CITRATE (PF) 100 MCG/2ML IJ SOLN
INTRAMUSCULAR | Status: AC
  Filled 2021-04-09: qty 2

## 2021-04-09 MED ORDER — MIDAZOLAM HCL 2 MG/2ML IJ SOLN
INTRAMUSCULAR | Status: AC
  Filled 2021-04-09: qty 2

## 2021-04-09 MED ORDER — PROPOFOL 10 MG/ML IV BOLUS
INTRAVENOUS | Status: DC | PRN
  Administered 2021-04-09: 30 mg via INTRAVENOUS
  Administered 2021-04-09: 20 mg via INTRAVENOUS

## 2021-04-09 MED ORDER — GLYCOPYRROLATE 0.2 MG/ML IJ SOLN
INTRAMUSCULAR | Status: AC
  Filled 2021-04-09: qty 1

## 2021-04-09 MED ORDER — MIDAZOLAM HCL 2 MG/2ML IJ SOLN
INTRAMUSCULAR | Status: DC | PRN
  Administered 2021-04-09: 2 mg via INTRAVENOUS

## 2021-04-09 NOTE — Anesthesia Preprocedure Evaluation (Addendum)
Anesthesia Evaluation  Patient identified by MRN, date of birth, ID band Patient awake    Reviewed: Allergy & Precautions, NPO status , Patient's Chart, lab work & pertinent test results  Airway Mallampati: III  TM Distance: >3 FB Neck ROM: full    Dental no notable dental hx. (+) Teeth Intact   Pulmonary sleep apnea , former smoker,    Pulmonary exam normal        Cardiovascular negative cardio ROS Normal cardiovascular exam     Neuro/Psych negative neurological ROS  negative psych ROS   GI/Hepatic Neg liver ROS, GERD  ,  Endo/Other  negative endocrine ROS  Renal/GU Renal disease  negative genitourinary   Musculoskeletal   Abdominal Normal abdominal exam  (+)   Peds  Hematology negative hematology ROS (+)   Anesthesia Other Findings Past Medical History: No date: DDD (degenerative disc disease), lumbar No date: Dermatitis No date: GERD (gastroesophageal reflux disease) No date: Hyperlipidemia No date: IBS (irritable bowel syndrome) No date: Nephrolithiasis No date: Sleep apnea     Comment:  VPAP No date: Thrombocytopenia ([place])     Comment:  mild  Past Surgical History: No date: APPENDECTOMY No date: COLONOSCOPY 06/23/2017: COLONOSCOPY WITH PROPOFOL; N/A     Comment:  Procedure: COLONOSCOPY WITH PROPOFOL;  Surgeon:               Lollie Sails, MD;  Location: [facility] ENDOSCOPY;                Service: Endoscopy;  Laterality: N/A; 11/27/2015: ESOPHAGOGASTRODUODENOSCOPY (EGD) WITH PROPOFOL; N/A     Comment:  Procedure: ESOPHAGOGASTRODUODENOSCOPY (EGD) WITH               PROPOFOL;  Surgeon: Hulen Luster, MD;  Location: [facility]               ENDOSCOPY;  Service: Gastroenterology;  Laterality: N/A; 01/2007: EYE SURGERY; Right     Comment:  extraction cataract extracapsular early 2000s: HERNIA REPAIR; Right     Comment:  inguinal   BMI    Body Mass Index: 28.80 kg/m      Reproductive/Obstetrics negative OB  ROS                            Anesthesia Physical Anesthesia Plan  ASA: 2  Anesthesia Plan: General   Post-op Pain Management:    Induction: Intravenous  PONV Risk Score and Plan: Propofol infusion and TIVA  Airway Management Planned: Natural Airway and Nasal Cannula  Additional Equipment:   Intra-op Plan:   Post-operative Plan:   Informed Consent: I have reviewed the patients History and Physical, chart, labs and discussed the procedure including the risks, benefits and alternatives for the proposed anesthesia with the patient or authorized representative who has indicated his/her understanding and acceptance.     Dental Advisory Given  Plan Discussed with: Anesthesiologist, CRNA and Surgeon  Anesthesia Plan Comments: (Patient consented for risks of anesthesia including but not limited to:  - adverse reactions to medications - risk of airway placement if required - damage to eyes, teeth, lips or other oral mucosa - nerve damage due to positioning  - sore throat or hoarseness - Damage to heart, brain, nerves, lungs, other parts of body or loss of life  Patient voiced understanding.)        Anesthesia Quick Evaluation

## 2021-04-09 NOTE — Transfer of Care (Signed)
Immediate Anesthesia Transfer of Care Note  Patient: Adrian Alvarado  Procedure(s) Performed: COLONOSCOPY WITH PROPOFOL  Patient Location: PACU  Anesthesia Type:General  Level of Consciousness: sedated  Airway & Oxygen Therapy: Patient Spontanous Breathing and Patient connected to nasal cannula oxygen  Post-op Assessment: Report given to RN and Post -op Vital signs reviewed and stable  Post vital signs: Reviewed and stable  Last Vitals:  Vitals Value Taken Time  BP    Temp    Pulse 77 04/09/21 1121  Resp 12 04/09/21 1121  SpO2 99 % 04/09/21 1121  Vitals shown include unvalidated device data.  Last Pain:  Vitals:   04/09/21 1121  TempSrc: (P) Temporal  PainSc:          Complications: No notable events documented.

## 2021-04-09 NOTE — Interval H&P Note (Signed)
History and Physical Interval Note:  04/09/2021 10:50 AM  Adrian Alvarado  has presented today for surgery, with the diagnosis of hx of adenomatous polyps.  The various methods of treatment have been discussed with the patient and family. After consideration of risks, benefits and other options for treatment, the patient has consented to  Procedure(s) with comments: COLONOSCOPY WITH PROPOFOL (N/A) - DM as a surgical intervention.  The patient's history has been reviewed, patient examined, no change in status, stable for surgery.  I have reviewed the patient's chart and labs.  Questions were answered to the patient's satisfaction.     Lesly Rubenstein  Ok to proceed with colonoscopy

## 2021-04-09 NOTE — Anesthesia Postprocedure Evaluation (Signed)
Anesthesia Post Note  Patient: Adrian Alvarado  Procedure(s) Performed: COLONOSCOPY WITH PROPOFOL  Patient location during evaluation: Endoscopy Anesthesia Type: General Level of consciousness: awake and alert Pain management: pain level controlled Vital Signs Assessment: post-procedure vital signs reviewed and stable Respiratory status: spontaneous breathing, nonlabored ventilation, respiratory function stable and patient connected to nasal cannula oxygen Cardiovascular status: blood pressure returned to baseline and stable Postop Assessment: no apparent nausea or vomiting Anesthetic complications: no   No notable events documented.   Last Vitals:  Vitals:   04/09/21 1131 04/09/21 1141  BP: 98/63 106/64  Pulse: 81   Resp: 16   Temp:    SpO2: 95%     Last Pain:  Vitals:   04/09/21 1141  TempSrc:   PainSc: 0-No pain                 Margaree Mackintosh

## 2021-04-09 NOTE — Op Note (Signed)
[facility] Gastroenterology Patient Name: Adrian Alvarado Procedure Date: 04/09/2021 10:49 AM MRN: [id number] Account #: [id number] Date of Birth: [birth year] Admit Type: Outpatient Age: 65 Room: [facility] ENDO ROOM 3 Gender: Male Note Status: Finalized Procedure:             Colonoscopy Indications:           Surveillance: Personal history of adenomatous polyps                         on last colonoscopy > 3 years ago, High risk colon                         cancer surveillance: Personal history of adenoma (10                         mm or greater in size) Providers:             Andrey Farmer MD, MD Referring MD:          Christena Flake. Raechel Ache, MD (Referring MD) Medicines:             Monitored Anesthesia Care Complications:         No immediate complications. Estimated blood loss:                         Minimal. Procedure:             Pre-Anesthesia Assessment:                        - Prior to the procedure, a History and Physical was                         performed, and patient medications and allergies were                         reviewed. The patient is competent. The risks and                         benefits of the procedure and the sedation options and                         risks were discussed with the patient. All questions                         were answered and informed consent was obtained.                         Patient identification and proposed procedure were                         verified by the physician, the nurse, the anesthetist                         and the technician in the endoscopy suite. Mental                         Status Examination: alert and oriented. Airway  Examination: normal oropharyngeal airway and neck                         mobility. Respiratory Examination: clear to                         auscultation. CV Examination: normal. Prophylactic                         Antibiotics: The patient does not  require prophylactic                         antibiotics. Prior Anticoagulants: The patient has                         taken no previous anticoagulant or antiplatelet                         agents. ASA Grade Assessment: II - A patient with mild                         systemic disease. After reviewing the risks and                         benefits, the patient was deemed in satisfactory                         condition to undergo the procedure. The anesthesia                         plan was to use monitored anesthesia care (MAC).                         Immediately prior to administration of medications,                         the patient was re-assessed for adequacy to receive                         sedatives. The heart rate, respiratory rate, oxygen                         saturations, blood pressure, adequacy of pulmonary                         ventilation, and response to care were monitored                         throughout the procedure. The physical status of the                         patient was re-assessed after the procedure.                        After obtaining informed consent, the colonoscope was                         passed under direct vision. Throughout the procedure,  the patient's blood pressure, pulse, and oxygen                         saturations were monitored continuously. The                         Colonoscope was introduced through the anus and                         advanced to the the cecum, identified by appendiceal                         orifice and ileocecal valve. The colonoscopy was                         somewhat difficult due to significant looping.                         Successful completion of the procedure was aided by                         applying abdominal pressure. The patient tolerated the                         procedure well. The quality of the bowel preparation                         was  good. Findings:      The perianal and digital rectal examinations were normal.      A 1 mm polyp was found in the proximal transverse colon. The polyp was       sessile. The polyp was removed with a cold biopsy forceps. Resection and       retrieval were complete. Estimated blood loss was minimal.      Internal hemorrhoids were found during retroflexion. The hemorrhoids       were Grade I (internal hemorrhoids that do not prolapse).      The exam was otherwise without abnormality on direct and retroflexion       views. Impression:            - One 1 mm polyp in the proximal transverse colon,                         removed with a cold biopsy forceps. Resected and                         retrieved.                        - Internal hemorrhoids.                        - The examination was otherwise normal on direct and                         retroflexion views. Recommendation:        - Discharge patient to home.                        - Resume previous diet.                        -  Continue present medications.                        - Await pathology results.                        - Repeat colonoscopy for surveillance based on                         pathology results.                        - Return to referring physician as previously                         scheduled. Procedure Code(s):     --- Professional ---                        [id number], Colonoscopy, flexible; with biopsy, single or                         multiple Diagnosis Code(s):     --- Professional ---                        K63.5, Polyp of colon                        K64.0, First degree hemorrhoids                        Z86.010, Personal history of colonic polyps CPT copyright 2019 American [employer] Association. All rights reserved. The codes documented in this report are preliminary and upon coder review may  be revised to meet current compliance requirements. Andrey Farmer MD, MD 04/09/2021 11:27:59 AM Number  of Addenda: 0 Note Initiated On: 04/09/2021 10:49 AM Scope Withdrawal Time: 0 hours 6 minutes 50 seconds  Total Procedure Duration: 0 hours 19 minutes 32 seconds  Estimated Blood Loss:  Estimated blood loss was minimal.      [facility]

## 2021-04-09 NOTE — H&P (Signed)
Outpatient short stay form Pre-procedure 04/09/2021 10:47 AM Adrian Miyamoto MD, MPH  Primary Physician: Dr. Raechel Ache  Reason for visit:  Adenomatous polyp  History of present illness:   65 y/o gentleman with history of 1 cm adenomatous polyp on colonoscopy done > 3 years ago. No family history of GI malignancies. No blood thinners. Hx of appendectomy and hernia repair. Has ventral hernia.    Current Facility-Administered Medications:    0.9 %  sodium chloride infusion, , Intravenous, Continuous, Morena Mckissack, Hilton Cork, MD, Last Rate: 20 mL/hr at 04/09/21 1015, 20 mL/hr at 04/09/21 1015  Medications Prior to Admission  Medication Sig Dispense Refill Last Dose   alfuzosin (UROXATRAL) 10 MG 24 hr tablet Take 10 mg by mouth daily with breakfast.   Past Week   aspirin 325 MG tablet Take 325 mg by mouth daily.   Past Week   Cholecalciferol 5000 units TABS Take by mouth.   Past Week   Crisaborole 2 % OINT Apply topically.   Past Week   CYANOCOBALAMIN PO Take by mouth.   Past Week   esomeprazole (NEXIUM) 40 MG capsule Take 40 mg by mouth daily at 12 noon.   Past Week   fluconazole (DIFLUCAN) 200 MG tablet Take 1 tablet once a  Week 4 tablet 1 Past Week   Iodoquinol-Aloe Polysaccharide (ALOQUIN) 1.25-1 % GEL Apply topically.   Past Week   Iodoquinol-HC-Aloe Polysacch (ALCORTIN EX) Apply topically.   Past Week   magnesium oxide (MAG-OX) 400 MG tablet Take 400 mg by mouth daily.   Past Week   metFORMIN (GLUCOPHAGE-XR) 500 MG 24 hr tablet Take 500 mg by mouth daily with breakfast.   Past Week   metroNIDAZOLE (METROGEL) 0.75 % gel Apply 1 application topically 2 (two) times daily.   Past Week   miconazole (MICOTIN) 2 % powder Apply topically as needed for itching.   Past Week   mupirocin cream (BACTROBAN) 2 % Apply 1 application topically 2 (two) times daily. Until healed left hand laceration 15 g 0 Past Week   NON FORMULARY Apply 30 g topically every other day. Intertrigo Compound from [facility]    Past Week   pimecrolimus (ELIDEL) 1 % cream Apply topically every other day. To aa rash in groin prn flares 60 g 6 Past Week   polycarbophil (FIBERCON) 625 MG tablet Take 625 mg by mouth daily.   Past Week   Potassium 99 MG TABS Take by mouth.   Past Week   Probiotic Product (PROBIOTIC ADVANCED PO) Take by mouth.   Past Week   saccharomyces boulardii (FLORASTOR) 250 MG capsule Take 250 mg by mouth 2 (two) times daily.   Past Week   tacrolimus (PROTOPIC) 0.1 % ointment Apply topically every other day. 100 g 2 Past Week   tacrolimus (PROTOPIC) 0.1 % ointment Apply topically as directed. Qod to qd to aa groin prn flares 60 g 3 Past Week     Allergies  Allergen Reactions   Bentyl [Dicyclomine Hcl]    Prednisone      Past Medical History:  Diagnosis Date   DDD (degenerative disc disease), lumbar    Dermatitis    GERD (gastroesophageal reflux disease)    Hyperlipidemia    IBS (irritable bowel syndrome)    Nephrolithiasis    Sleep apnea    VPAP   Thrombocytopenia ([facility])    mild    Review of systems:  Otherwise negative.    Physical Exam  Gen: Alert, oriented. Appears stated age.  HEENT: PERRLA. Lungs: No respiratory distress CV: RRR Abd: soft, benign, no masses Ext: No edema    Planned procedures: Proceed with colonoscopy. The patient understands the nature of the planned procedure, indications, risks, alternatives and potential complications including but not limited to bleeding, infection, perforation, damage to internal organs and possible oversedation/side effects from anesthesia. The patient agrees and gives consent to proceed.  Please refer to procedure notes for findings, recommendations and patient disposition/instructions.     Adrian Miyamoto MD, MPH Gastroenterology 04/09/2021  10:47 AM

## 2021-04-10 ENCOUNTER — Encounter: Payer: Self-pay | Admitting: Gastroenterology

## 2021-04-10 LAB — SURGICAL PATHOLOGY

## 2021-06-04 ENCOUNTER — Other Ambulatory Visit: Payer: Self-pay

## 2021-06-04 ENCOUNTER — Ambulatory Visit (INDEPENDENT_AMBULATORY_CARE_PROVIDER_SITE_OTHER): Payer: Medicare Other | Admitting: Dermatology

## 2021-06-04 DIAGNOSIS — L304 Erythema intertrigo: Secondary | ICD-10-CM

## 2021-06-04 MED ORDER — FLUCONAZOLE 200 MG PO TABS: ORAL_TABLET | ORAL | 0 refills | Status: AC

## 2021-06-04 MED ORDER — PIMECROLIMUS 1 % EX CREA: TOPICAL_CREAM | CUTANEOUS | 6 refills | Status: DC

## 2021-06-04 NOTE — Patient Instructions (Signed)

## 2021-06-04 NOTE — Progress Notes (Signed)
   Follow-Up Visit   Subjective  Adrian Alvarado is a 65 y.o. male who presents for the following: Rash (6 months f/u intertrigo in the groin, treating with [facility] intertrigo cream alternating with Elidel cream prn with a good response, past treatment Diflucan tablets helped).  The following portions of the chart were reviewed this encounter and updated as appropriate:   Allergies  Meds  Problems  Med Hx  Surg Hx  Fam Hx     Review of Systems:  No other skin or systemic complaints except as noted in HPI or Assessment and Plan.  Objective  Well appearing patient in no apparent distress; mood and affect are within normal limits.  A focused examination was performed including groin. Relevant physical exam findings are noted in the Assessment and Plan.  groin Mild erythema    Assessment & Plan  Intertrigo -with symptoms of itching and burning.  Patient is doing much better on current regimen but still has flares.  He has to use medication a few days a week.  Not to goal. 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  It is exacerbated by increased movement / activity; sweating; and higher atmospheric temperature.   Restart Diflucan 200 mg take 1 tablet once a week #4 ORF  Cont Elidel cream prn Cont [facility] intertrigo cream prn  Related Medications fluconazole (DIFLUCAN) 200 MG tablet Take 1 tablet once a  Week  pimecrolimus (ELIDEL) 1 % cream Apply topically every other day. To aa rash in groin prn flares  Return in about 1 year (around 06/04/2022) for Intertrigo .  IMarye Round, CMA, am acting as scribe for Sarina Ser, MD .  Documentation: I have reviewed the above documentation for accuracy and completeness, and I agree with the above.  Sarina Ser, MD

## 2021-06-06 ENCOUNTER — Encounter: Payer: Self-pay | Admitting: Dermatology

## 2021-06-06 ENCOUNTER — Telehealth: Payer: Self-pay

## 2021-06-06 NOTE — Telephone Encounter (Signed)
Pimecrolimus denied by insurance - not on formulary. Alternative listed is tacrolimus.

## 2021-06-08 ENCOUNTER — Telehealth: Payer: Self-pay

## 2021-06-08 MED ORDER — TACROLIMUS 0.1 % EX OINT: TOPICAL_OINTMENT | CUTANEOUS | 3 refills | Status: DC

## 2021-06-08 NOTE — Telephone Encounter (Signed)
Patient states that he has been using Elidel but it cost about $400. I advised him we could send in [facility] mix to help with itching, but patient wants something that will help control condition and not just treat the symptoms. Discussed Tacrolimus 0.1% ointment sent to [facility] for $65. Patient would like to try that instead. Advised patient that if more than $65 contact us and we can send in a different medication.

## 2022-02-04 ENCOUNTER — Telehealth: Payer: Self-pay

## 2022-02-04 NOTE — Telephone Encounter (Signed)
Pt called requesting refill of [facility] intertrigo cream, okay refills for [facility] intertrigo cream sent to [facility] today

## 2022-06-05 ENCOUNTER — Ambulatory Visit (INDEPENDENT_AMBULATORY_CARE_PROVIDER_SITE_OTHER): Payer: Medicare Other | Admitting: Dermatology

## 2022-06-05 DIAGNOSIS — Z79899 Other long term (current) drug therapy: Secondary | ICD-10-CM | POA: Diagnosis not present

## 2022-06-05 DIAGNOSIS — L304 Erythema intertrigo: Secondary | ICD-10-CM

## 2022-06-05 MED ORDER — HYDROCORTISONE ACETATE 2.5 % EX CREA: 1.0000 | TOPICAL_CREAM | CUTANEOUS | 11 refills | Status: AC

## 2022-06-05 MED ORDER — EUCRISA 2 % EX OINT
1.0000 | TOPICAL_OINTMENT | CUTANEOUS | 11 refills | Status: AC
Start: 2022-06-05 — End: ?

## 2022-06-05 NOTE — Progress Notes (Unsigned)
   Follow-Up Visit   Subjective  Adrian Alvarado is a 66 y.o. male who presents for the following: Intertrigo (Groin, [facility] Intertrigo mix qd, Eucrisa oint prn, recently flaring, itching).  The following portions of the chart were reviewed this encounter and updated as appropriate:   Tobacco  Allergies  Meds  Problems  Med Hx  Surg Hx  Fam Hx     Review of Systems:  No other skin or systemic complaints except as noted in HPI or Assessment and Plan.  Objective  Well appearing patient in no apparent distress; mood and affect are within normal limits.  A focused examination was performed including groin. Relevant physical exam findings are noted in the Assessment and Plan.  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 Condition is bothersome/symptomatic for patient. Currently flared, but still overall better than in the past and generally is mostly well controlled.  Intertrigo is a chronic recurrent rash that occurs in skin fold areas that may be associated with friction; heat; moisture; yeast; fungus; and bacteria.  It is exacerbated by increased movement / activity; sweating; and higher atmospheric temperature.  Cont [facility] Iodoquinol 1%, Hydrocortisone 2.5%, Niacinamide 2% Cream qod  alternating with Eucrisa ointment  Oral Diflucan with significant flares.  If Eucrisa ointment not covered will send in  [facility] Tofacitinib 2% / Niacinamide 2% Cream  Hydrocortisone Acetate 2.5 % CREA - groin Apply 1 Application topically as directed. Qod aa rash in groin until clear, then prn flares Crisaborole (EUCRISA) 2 % OINT - groin Apply 1 Application topically as directed. Qod to aa rash in groin prn flares  Related Medications fluconazole (DIFLUCAN) 200 MG tablet Take 1 tablet once a  Week  Return in about 1 year (around 06/06/2023) for Intertrigo f/u.  I,  Othelia Pulling, RMA, am acting as scribe for Sarina Ser, MD . Documentation: I have reviewed the above documentation for accuracy and completeness, and I agree with the above.  Sarina Ser, MD

## 2022-06-05 NOTE — Patient Instructions (Addendum)
Instructions for [facility] Medications  One or more of your medications was sent to the [facility] mail order compounding pharmacy. You will receive an email from them and can purchase the medicine through that link. It will then be mailed to your home at the address you confirmed. If for any reason you do not receive an email from them, please check your spam folder. If you still do not find the email, please let us know. [facility] phone number is [phone].       Due to recent changes in healthcare laws, you may see results of your pathology and/or laboratory studies on MyChart before the doctors have had a chance to review them. We understand that in some cases there may be results that are confusing or concerning to you. Please understand that not all results are received at the same time and often the doctors may need to interpret multiple results in order to provide you with the best plan of care or course of treatment. Therefore, we ask that you please give us 2 business days to thoroughly review all your results before contacting the office for clarification. Should we see a critical lab result, you will be contacted sooner.   If You Need Anything After Your Visit  If you have any questions or concerns for your doctor, please call our main line at [phone] and press option 4 to reach your doctor's medical assistant. If no one answers, please leave a voicemail as directed and we will return your call as soon as possible. Messages left after 4 pm will be answered the following business day.   You may also send us a message via MyChart. We typically respond to MyChart messages within 1-2 business days.  For prescription refills, please ask your pharmacy to contact our office. Our fax number is [phone].  If you have an urgent issue when the clinic is closed that cannot wait until the next business day, you can page your doctor at the number below.    Please note  that while we do our best to be available for urgent issues outside of office hours, we are not available 24/7.   If you have an urgent issue and are unable to reach us, you may choose to seek medical care at your doctor's office, retail clinic, urgent care center, or emergency room.  If you have a medical emergency, please immediately call 911 or go to the emergency department.  Pager Numbers  - Dr. Kowalski: [phone]  - Dr. Moye: [phone]  - Dr. Stewart: [phone]  In the event of inclement weather, please call our main line at [phone] for an update on the status of any delays or closures.  Dermatology Medication Tips: Please keep the boxes that topical medications come in in order to help keep track of the instructions about where and how to use these. Pharmacies typically print the medication instructions only on the boxes and not directly on the medication tubes.   If your medication is too expensive, please contact our office at [phone] option 4 or send us a message through MyChart.   We are unable to tell what your co-pay for medications will be in advance as this is different depending on your insurance coverage. However, we may be able to find a substitute medication at lower cost or fill out paperwork to get insurance to cover a needed medication.   If a prior authorization is required to get your medication covered by your insurance   company, please allow us 1-2 business days to complete this process.  Drug prices often vary depending on where the prescription is filled and some pharmacies may offer cheaper prices.  The website www.goodrx.com contains coupons for medications through different pharmacies. The prices here do not account for what the cost may be with help from insurance (it may be cheaper with your insurance), but the website can give you the price if you did not use any insurance.  - You can print the associated coupon and take it with your  prescription to the pharmacy.  - You may also stop by our office during regular business hours and pick up a GoodRx coupon card.  - If you need your prescription sent electronically to a different pharmacy, notify our office through [place] MyChart or by phone at [phone] option 4.     Si Usted Necesita Algo Despus de Su Visita  Tambin puede enviarnos un mensaje a travs de MyChart. Por lo general respondemos a los mensajes de MyChart en el transcurso de 1 a 2 das hbiles.  Para renovar recetas, por favor pida a su farmacia que se ponga en contacto con nuestra oficina. Nuestro nmero de fax es el [phone].  Si tiene un asunto urgente cuando la clnica est cerrada y que no puede esperar hasta el siguiente da hbil, puede llamar/localizar a su doctor(a) al nmero que aparece a continuacin.   Por favor, tenga en cuenta que aunque hacemos todo lo posible para estar disponibles para asuntos urgentes fuera del horario de oficina, no estamos disponibles las 24 horas del da, los 7 das de la semana.   Si tiene un problema urgente y no puede comunicarse con nosotros, puede optar por buscar atencin mdica  en el consultorio de su doctor(a), en una clnica privada, en un centro de atencin urgente o en una sala de emergencias.  Si tiene una emergencia mdica, por favor llame inmediatamente al 911 o vaya a la sala de emergencias.  Nmeros de bper  - Dr. Kowalski: [phone]  - Dra. Moye: [phone]  - Dra. Stewart: [phone]  En caso de inclemencias del tiempo, por favor llame a nuestra lnea principal al [phone] para una actualizacin sobre el estado de cualquier retraso o cierre.  Consejos para la medicacin en dermatologa: Por favor, guarde las cajas en las que vienen los medicamentos de uso tpico para ayudarle a seguir las instrucciones sobre dnde y cmo usarlos. Las farmacias generalmente imprimen las instrucciones del medicamento slo en las cajas y no  directamente en los tubos del medicamento.   Si su medicamento es muy caro, por favor, pngase en contacto con nuestra oficina llamando al [phone] y presione la opcin 4 o envenos un mensaje a travs de MyChart.   No podemos decirle cul ser su copago por los medicamentos por adelantado ya que esto es diferente dependiendo de la cobertura de su seguro. Sin embargo, es posible que podamos encontrar un medicamento sustituto a menor costo o llenar un formulario para que el seguro cubra el medicamento que se considera necesario.   Si se requiere una autorizacin previa para que su compaa de seguros cubra su medicamento, por favor permtanos de 1 a 2 das hbiles para completar este proceso.  Los precios de los medicamentos varan con frecuencia dependiendo del lugar de dnde se surte la receta y alguna farmacias pueden ofrecer precios ms baratos.  El sitio web www.goodrx.com tiene cupones para medicamentos de diferentes farmacias. Los precios aqu no tienen en cuenta   lo que podra costar con la ayuda del seguro (puede ser ms barato con su seguro), pero el sitio web puede darle el precio si no utiliz ningn seguro.  - Puede imprimir el cupn correspondiente y llevarlo con su receta a la farmacia.  - Tambin puede pasar por nuestra oficina durante el horario de atencin regular y recoger una tarjeta de cupones de GoodRx.  - Si necesita que su receta se enve electrnicamente a una farmacia diferente, informe a nuestra oficina a travs de MyChart de [place] o por telfono llamando al [phone] y presione la opcin 4.  

## 2022-06-06 ENCOUNTER — Encounter: Payer: Self-pay | Admitting: Dermatology

## 2022-06-19 ENCOUNTER — Telehealth: Payer: Self-pay

## 2022-06-19 NOTE — Telephone Encounter (Signed)
Eucrisa not covered by patients insurance. [facility] Tofacitinib 2% / Niacinamide 2% cream sent.

## 2023-06-18 ENCOUNTER — Ambulatory Visit: Payer: Medicare Other | Admitting: Dermatology

## 2023-06-18 ENCOUNTER — Encounter: Payer: Self-pay | Admitting: Dermatology

## 2023-06-18 DIAGNOSIS — L84 Corns and callosities: Secondary | ICD-10-CM | POA: Diagnosis not present

## 2023-06-18 DIAGNOSIS — L304 Erythema intertrigo: Secondary | ICD-10-CM | POA: Diagnosis not present

## 2023-06-18 DIAGNOSIS — Z79899 Other long term (current) drug therapy: Secondary | ICD-10-CM

## 2023-06-18 DIAGNOSIS — Z7189 Other specified counseling: Secondary | ICD-10-CM

## 2023-06-18 MED ORDER — TACROLIMUS 0.1 % EX OINT: TOPICAL_OINTMENT | CUTANEOUS | 3 refills | Status: AC

## 2023-06-18 MED ORDER — HYDROCORTISONE 2.5 % EX CREA: TOPICAL_CREAM | CUTANEOUS | 11 refills | Status: AC

## 2023-06-18 NOTE — Progress Notes (Signed)
   Follow-Up Visit   Subjective  Adrian Alvarado is a 67 y.o. male who presents for the following: Intertrigo of the groin - well controlled with Tacrolimus 0.1% ointment alternating with [facility] intertrigo mix every other day. He can go a few days with out using medications, and he restarts it when condition flares.   The patient has spots, moles and lesions to be evaluated, some may be new or changing and the patient may have concern these could be cancer.   The following portions of the chart were reviewed this encounter and updated as appropriate: medications, allergies, medical history  Review of Systems:  No other skin or systemic complaints except as noted in HPI or Assessment and Plan.  Objective  Well appearing patient in no apparent distress; mood and affect are within normal limits.   A focused examination was performed of the following areas: the face, scalp, arms, and hands   Relevant exam findings are noted in the Assessment and Plan.    Assessment & Plan   INTERTRIGO Exam Clear  Chronic condition with duration or expected duration over one year. Currently well-controlled.  Intertrigo is a chronic recurrent rash that occurs in skin fold areas that may be associated with friction; heat; moisture; yeast; fungus; and bacteria.  It is exacerbated by increased movement / activity; sweating; and higher atmospheric temperature.  Treatment Plan Continue [facility] intertrigo mix every other day alternating with Tacrolimus 0.1% ointment every other day PRN.   Callus - L foot lat great toe and med 2nd toe with hyperkeratosis and tenderness Recommend silicone toe separator if not improved recommend referral to podiatrist.   Return in about 1 year (around 06/17/2024) for intertrigo follow up.  Maylene Roes, CMA, am acting as scribe for Armida Sans, MD .  Documentation: I have reviewed the above documentation for accuracy and completeness, and I agree  with the above.  Armida Sans, MD

## 2023-06-18 NOTE — Patient Instructions (Signed)
Recommend toe separator to help reduce callus.    Due to recent changes in healthcare laws, you may see results of your pathology and/or laboratory studies on MyChart before the doctors have had a chance to review them. We understand that in some cases there may be results that are confusing or concerning to you. Please understand that not all results are received at the same time and often the doctors may need to interpret multiple results in order to provide you with the best plan of care or course of treatment. Therefore, we ask that you please give [place] 2 business days to thoroughly review all your results before contacting the office for clarification. Should we see a critical lab result, you will be contacted sooner.   If You Need Anything After Your Visit  If you have any questions or concerns for your doctor, please call our main line at [phone] and press option 4 to reach your doctor's medical assistant. If no one answers, please leave a voicemail as directed and we will return your call as soon as possible. Messages left after 4 pm will be answered the following business day.   You may also send [place] a message via MyChart. We typically respond to MyChart messages within 1-2 business days.  For prescription refills, please ask your pharmacy to contact our office. Our fax number is [phone].  If you have an urgent issue when the clinic is closed that cannot wait until the next business day, you can page your doctor at the number below.    Please note that while we do our best to be available for urgent issues outside of office hours, we are not available 24/7.   If you have an urgent issue and are unable to reach [place], you may choose to seek medical care at your doctor's office, retail clinic, urgent care center, or emergency room.  If you have a medical emergency, please immediately call 911 or go to the emergency department.  Pager Numbers  - Dr. Gwen Pounds: [phone]  -  Dr. Roseanne Reno: [phone]  - Dr. Katrinka Blazing: [phone]   In the event of inclement weather, please call our main line at [phone] for an update on the status of any delays or closures.  Dermatology Medication Tips: Please keep the boxes that topical medications come in in order to help keep track of the instructions about where and how to use these. Pharmacies typically print the medication instructions only on the boxes and not directly on the medication tubes.   If your medication is too expensive, please contact our office at [phone] option 4 or send [place] a message through MyChart.   We are unable to tell what your co-pay for medications will be in advance as this is different depending on your insurance coverage. However, we may be able to find a substitute medication at lower cost or fill out paperwork to get insurance to cover a needed medication.   If a prior authorization is required to get your medication covered by your insurance company, please allow [place] 1-2 business days to complete this process.  Drug prices often vary depending on where the prescription is filled and some pharmacies may offer cheaper prices.  The website www.goodrx.com contains coupons for medications through different pharmacies. The prices here do not account for what the cost may be with help from insurance (it may be cheaper with your insurance), but the website can give you the price if you did not use any insurance.  -  You can print the associated coupon and take it with your prescription to the pharmacy.  - You may also stop by our office during regular business hours and pick up a GoodRx coupon card.  - If you need your prescription sent electronically to a different pharmacy, notify our office through [facility] or by phone at [phone] option 4.     Si Usted Necesita Algo Despus de Su Visita  Tambin puede enviarnos un mensaje a travs de Clinical cytogeneticist. Por lo general respondemos a los  mensajes de MyChart en el transcurso de 1 a 2 das hbiles.  Para renovar recetas, por favor pida a su farmacia que se ponga en contacto con nuestra oficina. Annie Sable de fax es [place] [phone].  Si tiene un asunto urgente cuando la clnica est cerrada y que no puede esperar hasta el siguiente da hbil, puede llamar/localizar a su doctor(a) al nmero que aparece a continuacin.   Por favor, tenga en cuenta que aunque hacemos todo lo posible para estar disponibles para asuntos urgentes fuera del horario de [place], no estamos disponibles las 24 horas del da, los 7 [address]  [address] de la [place].   Si tiene un problema urgente y no puede comunicarse con nosotros, puede optar por buscar atencin mdica  en el consultorio de su doctor(a), en una clnica privada, en un centro de atencin urgente o en una sala de emergencias.  Si tiene Engineer, drilling, por favor llame inmediatamente al 911 o vaya a la sala de emergencias.  Nmeros de bper  - Dr. Gwen Pounds: [phone]  - Dra. Roseanne Reno: [phone]  - Dr. Katrinka Blazing: [phone]   En caso de inclemencias del tiempo, por favor llame a Lacy Duverney principal al [phone] para una actualizacin sobre el [place] de cualquier retraso o cierre.  Consejos para la medicacin en dermatologa: Por favor, guarde las cajas en las que vienen los medicamentos de uso tpico para ayudarle a seguir las instrucciones sobre dnde y cmo usarlos. Las farmacias generalmente imprimen las instrucciones del medicamento slo en las cajas y no directamente en los tubos del [place].   Si su medicamento es muy caro, por favor, pngase en contacto con Rolm [place]ala llamando al [phone] y presione la opcin 4 o envenos un mensaje a travs de Clinical cytogeneticist.   No podemos decirle cul ser su copago por los medicamentos por adelantado ya que esto es diferente dependiendo de la cobertura de su seguro. Sin embargo, es posible que podamos encontrar un medicamento sustituto a  Audiological scientist un formulario para que el seguro cubra el medicamento que se considera necesario.   Si se requiere una autorizacin previa para que su compaa de seguros [place] su medicamento, por favor permtanos de 1 a 2 das hbiles para completar [address]  Los precios de los medicamentos varan con frecuencia dependiendo del Environmental consultant de dnde se surte la receta y alguna farmacias pueden ofrecer precios ms baratos.  El sitio web www.goodrx.com tiene cupones para medicamentos de Health and safety inspector. Los precios aqu no tienen en cuenta lo que podra costar con la ayuda del seguro (puede ser ms barato con su seguro), pero el sitio web puede darle el precio si no utiliz Tourist information centre manager.  - Puede imprimir el cupn correspondiente y llevarlo con su receta a la farmacia.  - Tambin puede pasar por nuestra oficina durante el horario de atencin regular y Education officer, museum una tarjeta de cupones de GoodRx.  - Si necesita que su receta se enve electrnicamente a [place] [employer],  informe a nuestra oficina a travs de MyChart de [place] o por telfono llamando al [phone] y presione la opcin 4.

## 2024-06-17 ENCOUNTER — Ambulatory Visit: Payer: Medicare Other | Admitting: Dermatology
